# Patient Record
Sex: Male | Born: 1960
Health system: Southern US, Community
[De-identification: ages and names within clinical notes are randomized; demographics above are authoritative.]

## PROBLEM LIST (undated history)

## (undated) DIAGNOSIS — I472 Ventricular tachycardia: Secondary | ICD-10-CM

## (undated) DIAGNOSIS — K3580 Unspecified acute appendicitis: Secondary | ICD-10-CM

## (undated) DIAGNOSIS — E039 Hypothyroidism, unspecified: Secondary | ICD-10-CM

## (undated) DIAGNOSIS — I4729 Other ventricular tachycardia: Secondary | ICD-10-CM

## (undated) DIAGNOSIS — I10 Essential (primary) hypertension: Secondary | ICD-10-CM

## (undated) DIAGNOSIS — F419 Anxiety disorder, unspecified: Secondary | ICD-10-CM

## (undated) HISTORY — DX: Anxiety disorder, unspecified: F41.9

## (undated) HISTORY — DX: Unspecified acute appendicitis: K35.80

## (undated) HISTORY — DX: Other ventricular tachycardia: I47.29

## (undated) HISTORY — DX: Hypothyroidism, unspecified: E03.9

## (undated) HISTORY — DX: Ventricular tachycardia: I47.2

---

## 2007-12-09 DIAGNOSIS — K3580 Unspecified acute appendicitis: Secondary | ICD-10-CM

## 2007-12-09 HISTORY — PX: LAPAROSCOPIC APPENDECTOMY: SUR753

## 2007-12-09 HISTORY — DX: Unspecified acute appendicitis: K35.80

## 2008-03-21 ENCOUNTER — Ambulatory Visit (HOSPITAL_COMMUNITY): Admission: EM | Admit: 2008-03-21 | Discharge: 2008-03-21 | Payer: Self-pay | Admitting: Family Medicine

## 2008-03-21 ENCOUNTER — Encounter (INDEPENDENT_AMBULATORY_CARE_PROVIDER_SITE_OTHER): Payer: Self-pay | Admitting: General Surgery

## 2011-04-22 NOTE — Op Note (Signed)
David Moreno, David Moreno            ACCOUNT NO.:  [id number]   MEDICAL RECORD NO.:  [id number]          PATIENT TYPE:  INP   LOCATION:  5522                         FACILITY:  [facility]   PHYSICIAN:  Sharlet Salina T. Hoxworth, M.D.DATE OF BIRTH:  1961/10/24   DATE OF PROCEDURE:  03/21/2008  DATE OF DISCHARGE:  03/21/2008                               OPERATIVE REPORT   PREOPERATIVE DIAGNOSIS:  Acute appendicitis.   POSTOPERATIVE DIAGNOSIS:  Acute appendicitis.   SURGICAL PROCEDURE:  Laparoscopic appendectomy.   SURGEON:  Lorne Skeens. Hoxworth, MD   ANESTHESIA:  General.   BRIEF HISTORY:  David Moreno is a generally healthy 50 year old male  who presents with typical findings for acute appendicitis.  I have  recommended proceeding with laparoscopic and possible open appendectomy.  The nature of procedure, its indications, risks of bleeding, infection,  and anesthetic risks have been discussed and understood.  He has been  brought to the operating room for this procedure.   DESCRIPTION OF OPERATION:  The patient brought to the operating room and  placed in supine position on the operating table and general  endotracheal anesthesia was induced.  Foley catheter was placed.  He  received broad-spectrum preoperative IV antibiotics.  The abdomen was  widely sterilely prepped and draped.  Correct patient and procedure were  verified.  Local anesthesia was used to infiltrate the trocar sites.  An  open Roseanne Reno technique was used through a 1-cm infraumbilical incision  through a mattress suture of 0 Vicryl.  Pneumoperitoneum established.  Under direct vision, a 5-mm trocar was placed in the right upper  quadrant and a 12-mm trocar in the left lower quadrant.  The appendix  was lying anteriorly, easily visualized, and was acutely inflamed with  exudate, but no perforation or gangrene.  The appendix was inflamed up  to but not involving the very base.  Appendix was mobilized dividing  some  lateral peritoneal attachments.  The mesoappendix was then  sequentially divided with a Harmonic scalpel, completely freeing the  appendix down to the tip of the cecum.  The appendix was then divided at  the tip of the cecum with a single firing of the Endo GIA 45-mm blue  load stapler.  There was a tiny bit of bleeding along the staple line  that was controlled with Harmonic scalpel.  Complete hemostasis was  assured, and the right lower quadrant and abdomen were thoroughly  irrigated and suctioned until clear.  Appendix was placed in an  EndoCatch bag and brought out through the umbilical incision.  The  trocars removed, all CO2 evacuated, and the mattress sutures secured the  umbilicus.  Skin incisions were closed with subcuticular 5-0 Monocryl  and Dermabond.  Sponge and needle counts correct.  The patient was taken  to recovery in good condition.      Lorne Skeens. Hoxworth, M.D.  Electronically Signed    BTH/MEDQ  D:  03/21/2008  T:  03/21/2008  Job:  [id number]

## 2011-04-22 NOTE — H&P (Signed)
[name], [name]            ACCOUNT NO.:  [id number]   MEDICAL RECORD NO.:  [id number]          PATIENT TYPE:  INP   LOCATION:  1828                         FACILITY:  [facility]   PHYSICIAN:  Sharlet Salina T. Hoxworth, M.D.DATE OF BIRTH:  05-Jul-1961   DATE OF ADMISSION:  03/21/2008  DATE OF DISCHARGE:                              HISTORY & PHYSICAL   CHIEF COMPLAINTS:  Abdominal pain.   PRESENT ILLNESS:  David Moreno is a 50 year old male who about 12  hours ago had the onset of vague diffuse mid abdominal pain.  This  gradually worsened and became fairly intense.  [facility], was referred to [facility] ER for further evaluation.  He states  his pain actually then gradually subsided, but became localized in the  right lower quadrant.  It is somewhat worse with motion.  He has no  nausea or vomiting.  No fever or chills.  No diarrhea or urinary  symptoms.  He has no previous history of any similar complaints or  chronic abdominal pain or GI complaints.   PAST MEDICAL HISTORY:  Previous surgery includes left hand injury as a  child and Achilles tendon rupture.  Medically, he is followed for mild  anxiety and hypothyroidism.   MEDICATIONS:  1. Lexapro 10 mg daily.  2. Synthroid 100 mcg daily.   ALLERGIES:  NONE.   SOCIAL HISTORY:  No cigarettes, occasional alcohol.  Married.  Works for  a [employer].   FAMILY HISTORY:  Significant for mother with history of MI.   REVIEW OF SYSTEMS:  GENERAL: No fever, chills, or weight change.  HEENT:  No vision, hearing, or swallowing problems.  RESPIRATORY:  No shortness  of breath, cough, or wheezing.  CARDIAC: No chest pain, palpitations, or history of heart disease.  GI  AND GU: As above.   PHYSICAL EXAMINATION:  VITAL SIGNS: Temperature is 97.9, pulse 63,  respirations 20, and blood pressure 141/78.  GENERAL: Alert, well-developed, white male, in no acute stress.  SKIN: Warm and dry.  No rash or infection.  HEENT: No palpable mass or thyromegaly.  Sclerae nonicteric.  Oropharynx  is clear.  LUNGS: Clear without wheezing or increased work of breathing.  CARDIAC: Regular rate and rhythm.  No murmurs.  ABDOMEN: Nondistended.  There is well localized right lower quadrant  tenderness with some guarding.  No palpable masses or organomegaly.  Remainder of abdomen is soft and nontender.  GU: Normal male.  EXTREMITIES: No joint swelling or deformity.  NEUROLOGIC: Alert and fully oriented.  Motor and sensory exam is grossly  normal.   LABORATORY:  White count elevated at 14.8, hemoglobin is 14.4.  Electrolytes and UA normal.  CT scan of the abdomen and pelvis were  reviewed.  This shows the evidence of acute appendicitis without rupture  abscess.   ASSESSMENT AND PLAN:  Apparent acute appendicitis.  The patient is  receiving broad-spectrum antibiotics.  Will be taken to the operating  room for emergency laparoscopic appendectomy.      Lorne Skeens. Hoxworth, M.D.  Electronically Signed     BTH/MEDQ  D:  03/21/2008  T:  03/21/2008  Job:  [id number]

## 2011-08-08 ENCOUNTER — Other Ambulatory Visit: Payer: Self-pay | Admitting: Physical Medicine and Rehabilitation

## 2011-08-08 DIAGNOSIS — IMO0002 Reserved for concepts with insufficient information to code with codable children: Secondary | ICD-10-CM

## 2011-08-17 ENCOUNTER — Ambulatory Visit
Admission: RE | Admit: 2011-08-17 | Discharge: 2011-08-17 | Disposition: A | Discharge status: Home / Self Care | Payer: PRIVATE HEALTH INSURANCE | Source: Ambulatory Visit | Attending: Physical Medicine and Rehabilitation | Admitting: Physical Medicine and Rehabilitation

## 2011-08-17 DIAGNOSIS — IMO0002 Reserved for concepts with insufficient information to code with codable children: Secondary | ICD-10-CM

## 2011-09-02 LAB — DIFFERENTIAL
Basophils Absolute: 0
Lymphocytes Relative: 5 — ABNORMAL LOW
Monocytes Absolute: 0.7
Monocytes Relative: 5
Neutro Abs: 13.4 — ABNORMAL HIGH

## 2011-09-02 LAB — CBC
Hemoglobin: 14.4
RBC: 4.88
RDW: 13.7
WBC: 14.8 — ABNORMAL HIGH

## 2011-09-02 LAB — POCT URINALYSIS DIP (DEVICE)
Ketones, ur: >=160 — AB
pH: 6.5

## 2011-09-02 LAB — POCT I-STAT, CHEM 8
BUN: 15
Calcium, Ion: 1.2
Chloride: 102
Sodium: 139

## 2013-05-16 ENCOUNTER — Ambulatory Visit: Payer: PRIVATE HEALTH INSURANCE | Admitting: Cardiovascular Disease

## 2013-05-23 ENCOUNTER — Encounter: Payer: Self-pay | Admitting: Physician Assistant

## 2013-05-23 ENCOUNTER — Encounter: Payer: Self-pay | Admitting: Cardiovascular Disease

## 2013-05-24 ENCOUNTER — Ambulatory Visit: Payer: PRIVATE HEALTH INSURANCE | Admitting: Cardiovascular Disease

## 2013-05-31 ENCOUNTER — Encounter: Payer: Self-pay | Admitting: Cardiovascular Disease

## 2013-05-31 ENCOUNTER — Ambulatory Visit (INDEPENDENT_AMBULATORY_CARE_PROVIDER_SITE_OTHER): Payer: PRIVATE HEALTH INSURANCE | Admitting: Cardiovascular Disease

## 2013-05-31 VITALS — BP 110/80 | HR 47 | Ht 76.0 in | Wt 196.7 lb

## 2013-05-31 DIAGNOSIS — E039 Hypothyroidism, unspecified: Secondary | ICD-10-CM | POA: Insufficient documentation

## 2013-05-31 DIAGNOSIS — R002 Palpitations: Secondary | ICD-10-CM

## 2013-05-31 NOTE — Progress Notes (Signed)
Patient ID: David Moreno, male   DOB: Sep 19, 1961, 52 y.o.   MRN: [id number]     HPI: ANKUR SNOWDON, is a 52 y.o. 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 He has a history of hypothyroidism in the past had noted episodes of shortness of breath with activity and fatigue. He has been documented to have occasional PVCs as well as PACs during exercise. In September 2013 an echo Doppler study showed an ejection fraction greater than 55%. He had flat mitral valve closure without definitive prolapse, mild MR and mild TR without stenosis. On cardiac monitoring he was found to have 2 episodes of short-lived nonsustained ventricular tachycardia up to 10 beats which occurred during a period of increased stress he was rushing to go to an airport. In the past, he has developed fatigue Toprol was switched to the systolic. Since I last saw him, he did reduce his diastolic dose from 5 mg to 2.5 mg for decreased energy he remains relatively asymptomatic with reference to sensation any palpitations. He denies recent shortness of breath. There is no chest pain or he had undergone a previous nuclear perfusion study in September 2013 which was normal. He tells me his primary care physician recently titrated his thyroid replacement Synthroid from 0.125 mg to 0.1375 mg.  Past Medical History  Diagnosis Date  . Hypothyroidism   . NSVT (nonsustained ventricular tachycardia)     Nuclear stress test September 2013 was negative for ischemia. Exercise capacity 16 METS, low risk scan.  2-D echocardiogram September 2013 ejection fraction greater than 55%. No valvular disease noted.Marland Kitchen    No past surgical history on file.  Not on File  Current Outpatient Prescriptions  Medication Sig Dispense Refill  . aspirin 81 MG tablet Take 81 mg by mouth daily.      Marland Kitchen escitalopram (LEXAPRO) 10 MG tablet Take 10 mg by mouth daily.      Marland Kitchen levothyroxine (SYNTHROID, LEVOTHROID) 125 MCG tablet Take 125 mcg by mouth daily before  breakfast.      . nebivolol (BYSTOLIC) 5 MG tablet Take 5 mg by mouth daily. Takes 1/2 tablet      . zolpidem (AMBIEN) 10 MG tablet Take 10 mg by mouth at bedtime as needed for sleep. Takes 1/2 tablet prn       No current facility-administered medications for this visit.    Socially he is married and has 2 children. He is originally from [place]. He works in Airline pilot for [employer] There is no tobacco use. Does drink occasional alcohol.  ROS is negative for fever chills night sweats. He denies any recurrent awareness of palpitations. Fatigue has improved by reducing his Bystolic dose to 2.5 mg. He denies edema. He denies bleeding. He denies gynecologist. Other system review is negative.  PE BP 110/80  Pulse 47  Ht 6\' 4"  (1.93 m)  Wt 196 lb 11.2 oz (89.223 kg)  BMI 23.95 kg/m2  General: Alert, oriented, no distress.  Skin: normal turgor, no rashes HEENT: Normocephalic, atraumatic. Pupils round and reactive; sclera anicteric;no lid lag,  Nose without nasal septal hypertrophy Mouth/Parynx benign; Mallinpatti scale 2 Neck: No JVD, no carotid briuts Lungs: clear to ausculatation and percussion; no wheezing or rales Heart: RRR, s1 s2 norma;l 1/6 systolic murmur with an intermittent systolic click or Abdomen: soft, nontender; no hepatosplenomehaly, BS+; abdominal aorta nontender and not dilated by palpation. Pulses 2+ Extremities: no clubbing cyanosis or edema, Homan's sign negative  Neurologic: grossly nonfocal  ECG:  Sinus rhythm at 47 beats per minute with several atrial premature complexes  LABS:  BMET    Component Value Date/Time   NA 139 03/20/2008 2111   K 3.8 03/20/2008 2111   CL 102 03/20/2008 2111   GLUCOSE 122* 03/20/2008 2111   BUN 15 03/20/2008 2111   CREATININE 1.3 03/20/2008 2111     Hepatic Function Panel  No results found for this basename: prot, albumin, ast, alt, alkphos, bilitot, bilidir, ibili     CBC    Component Value Date/Time   WBC 14.8*  03/20/2008 2103   RBC 4.88 03/20/2008 2103   HGB 16.0 03/20/2008 2111   HCT 47.0 03/20/2008 2111   PLT 177 03/20/2008 2103   MCV 88.9 03/20/2008 2103   MCHC 33.2 03/20/2008 2103   RDW 13.7 03/20/2008 2103   LYMPHSABS 0.7 03/20/2008 2103   MONOABS 0.7 03/20/2008 2103   EOSABS 0.0 03/20/2008 2103   BASOSABS 0.0 03/20/2008 2103     BNP No results found for this basename: probnp    Lipid Panel  No results found for this basename: chol, trig, hdl, cholhdl, vldl, ldlcalc     RADIOLOGY: No results found.    ASSESSMENT AND PLAN: Mr. Rountree continues to feel well. He does have bradycardia at a very reduced rate of Bystolic at 2.5 mg and still has occasional PACs. In the past, he had developed a short burst of nonsustained VT during a stressful episode and for this reason I will not discontinue his low dose beta blocker therapy. Apparently his thyroid dose was recently increased and will be important to make certain he does not develop over suppression of his TSH. He tells me his primary physician we'll be rechecking his laboratory he has undergone recent colonoscopy that adverse effects. I will see him in 6 months for followup evaluation.     Lennette Bihari, MD, [facility]  05/31/2013 10:35 PM

## 2013-05-31 NOTE — Patient Instructions (Signed)
Your physician recommends that you schedule a follow-up appointment in: 6 months  

## 2013-06-01 ENCOUNTER — Encounter: Payer: Self-pay | Admitting: Cardiovascular Disease

## 2013-10-19 ENCOUNTER — Other Ambulatory Visit: Payer: Self-pay | Admitting: Cardiology

## 2013-10-19 NOTE — Telephone Encounter (Signed)
Rx was sent to pharmacy electronically. 

## 2013-11-22 ENCOUNTER — Ambulatory Visit (INDEPENDENT_AMBULATORY_CARE_PROVIDER_SITE_OTHER): Payer: PRIVATE HEALTH INSURANCE | Admitting: Cardiovascular Disease

## 2013-11-22 ENCOUNTER — Encounter: Payer: Self-pay | Admitting: Cardiovascular Disease

## 2013-11-22 VITALS — BP 120/70 | HR 52 | Ht 75.0 in | Wt 193.5 lb

## 2013-11-22 DIAGNOSIS — I4729 Other ventricular tachycardia: Secondary | ICD-10-CM

## 2013-11-22 DIAGNOSIS — E039 Hypothyroidism, unspecified: Secondary | ICD-10-CM

## 2013-11-22 DIAGNOSIS — R002 Palpitations: Secondary | ICD-10-CM

## 2013-11-22 DIAGNOSIS — I472 Ventricular tachycardia: Secondary | ICD-10-CM

## 2013-11-22 DIAGNOSIS — I491 Atrial premature depolarization: Secondary | ICD-10-CM

## 2013-11-22 NOTE — Patient Instructions (Signed)
Your physician recommends that you schedule a follow-up appointment in: one year with Dr Allyson Sabal  No changes made to your medications/therapy

## 2013-11-24 ENCOUNTER — Encounter: Payer: Self-pay | Admitting: Cardiovascular Disease

## 2013-11-24 DIAGNOSIS — I472 Ventricular tachycardia: Secondary | ICD-10-CM | POA: Insufficient documentation

## 2013-11-24 DIAGNOSIS — I4729 Other ventricular tachycardia: Secondary | ICD-10-CM | POA: Insufficient documentation

## 2013-11-24 DIAGNOSIS — I491 Atrial premature depolarization: Secondary | ICD-10-CM | POA: Insufficient documentation

## 2013-11-24 NOTE — Progress Notes (Signed)
Patient ID: David Moreno, male   DOB: 22-Jul-1961, 52 y.o.   MRN: [id number]      HPI: David Moreno, is a 52 y.o. male who presents for six month cardiology evaluation.   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 He has a history of hypothyroidism in the past had noted episodes of shortness of breath with activity and fatigue. He has been documented to have occasional PVCs as well as PACs during exercise. In September 2013 an echo Doppler study showed an ejection fraction greater than 55%. He had flat mitral valve closure without definitive prolapse, mild MR and mild TR without stenosis. On cardiac monitoring he was found to have 2 episodes of short-lived nonsustained ventricular tachycardia up to 10 beats which occurred during a period of increased stress he was rushing to go to an airport. In the past, he has developed fatigue Toprol was switched to  bystolic. Since I  reduce his diastolic dose from 5 mg to 2.5 mg for decreased energy he remains relatively asymptomatic with reference to sensation any palpitations. He denies recent shortness of breath. There is no chest pain or he had undergone a previous nuclear perfusion study in September 2013 which was normal. He tells me his primary care physician recently titrated his thyroid replacement Synthroid from 0.125 mg to 0.1375 mg.  Over the past year he has felt well. He remained active. He plays tennis. He denies any further spells of exertionally precipitated shortness of breath and fatigue.  Past Medical History  Diagnosis Date  . Hypothyroidism   . NSVT (nonsustained ventricular tachycardia)     Nuclear stress test September 2013 was negative for ischemia. Exercise capacity 16 METS, low risk scan.  2-D echocardiogram September 2013 ejection fraction greater than 55%. No valvular disease noted.Marland Kitchen    History reviewed. No pertinent past surgical history.  No Known Allergies  Current Outpatient  Prescriptions  Medication Sig Dispense Refill  . aspirin 81 MG tablet Take 81 mg by mouth daily.      Marland Kitchen escitalopram (LEXAPRO) 10 MG tablet Take 10 mg by mouth daily.      Marland Kitchen levothyroxine (SYNTHROID, LEVOTHROID) 125 MCG tablet Take 137 mcg by mouth daily before breakfast.       . nebivolol (BYSTOLIC) 5 MG tablet Take 0.5 tablets (2.5 mg total) by mouth daily.  15 tablet  5  . zolpidem (AMBIEN) 10 MG tablet Take 10 mg by mouth at bedtime as needed for sleep. Takes 1/2 tablet prn       No current facility-administered medications for this visit.    Socially he is married and has 2 children. He is originally from [place]. He works in Airline pilot for [employer] There is no tobacco use. Does drink occasional alcohol.  ROS is negative for fever chills night sweats. He denies skin rash. He denies cold or heat intolerance. He denies visual or hearing changes. There is no lymphadenopathy. No wheezing or increased sputum production. There is no presyncope or syncope. There is no chest pressure. He denies nausea vomiting or diarrhea. He is unaware of blood in stool or urine. He denies myalgias. He denies any recurrent awareness of palpitations. Fatigue has improved by reducing his Bystolic dose to 2.5 mg. He denies edema. He denies bleeding. There is no history of diabetes. He is on Synthroid for hypothyroidism. He denies difficulty with sleep or snoring.  Other comprehensive 14 system review is negative.  PE BP  120/70  Pulse 52  Ht 6\' 3"  (1.905 m)  Wt 193 lb 8 oz (87.771 kg)  BMI 24.19 kg/m2  Repeat blood pressure by me was 120/70 supine and was 118/72 standing. General: Alert, oriented, no distress.  Skin: normal turgor, no rashes HEENT: Normocephalic, atraumatic. Pupils round and reactive; sclera anicteric;no lid lag,  Nose without nasal septal hypertrophy Mouth/Parynx benign; Mallinpatti scale 2 Neck: No JVD, no carotid briuts Lungs: clear to ausculatation and percussion; no wheezing or  rales Chest: No tenderness to palpation Heart: RRR, s1 s2 norma;l 1/6 systolic murmur with an intermittent systolic click  Abdomen: soft, nontender; no hepatosplenomehaly, BS+; abdominal aorta nontender and not dilated by palpation. Back: No CVA tenderness Pulses 2+ Extremities: no clubbing cyanosis or edema, Homan's sign negative  Neurologic: grossly nonfocal; cranial nerves normal Psychological: Normal affect and mood. Normal cognition.  ECG: Sinus rhythm at 52 beats per minute with one isolated atrial premature complex.  LABS:  BMET    Component Value Date/Time   NA 139 03/20/2008 2111   K 3.8 03/20/2008 2111   CL 102 03/20/2008 2111   GLUCOSE 122* 03/20/2008 2111   BUN 15 03/20/2008 2111   CREATININE 1.3 03/20/2008 2111     Hepatic Function Panel  No results found for this basename: prot,  albumin,  ast,  alt,  alkphos,  bilitot,  bilidir,  ibili     CBC    Component Value Date/Time   WBC 14.8* 03/20/2008 2103   RBC 4.88 03/20/2008 2103   HGB 16.0 03/20/2008 2111   HCT 47.0 03/20/2008 2111   PLT 177 03/20/2008 2103   MCV 88.9 03/20/2008 2103   MCHC 33.2 03/20/2008 2103   RDW 13.7 03/20/2008 2103   LYMPHSABS 0.7 03/20/2008 2103   MONOABS 0.7 03/20/2008 2103   EOSABS 0.0 03/20/2008 2103   BASOSABS 0.0 03/20/2008 2103     BNP No results found for this basename: probnp    Lipid Panel  No results found for this basename: chol,  trig,  hdl,  cholhdl,  vldl,  ldlcalc     RADIOLOGY: No results found.    ASSESSMENT AND PLAN: David Moreno continues to feel well. He continues to be mildly bradycardic at a  reduced dose of Bystolic at 2.5 mg and  has rare PACs. In the past, he had developed a short burst of nonsustained VT during a stressful episode and for this reason I will not discontinue his low dose beta blocker therapy. His thyroid dose had been increased this year and he understands his TSH in June was normal at 3.59 on 137 mcg daily. His blood pressure is well controlled and  there are no orthostatic changes. I recommended he continue his current medical regimen. I will see him in one year for followup evaluation or sooner if problems arise.   Lennette Bihari, MD, [facility]  11/24/2013 9:45 PM

## 2013-11-25 ENCOUNTER — Encounter: Payer: Self-pay | Admitting: Cardiovascular Disease

## 2014-04-18 ENCOUNTER — Other Ambulatory Visit: Payer: Self-pay | Admitting: Cardiovascular Disease

## 2014-04-18 NOTE — Telephone Encounter (Signed)
Rx refill sent to patient pharmacy   

## 2014-06-07 ENCOUNTER — Encounter: Payer: Self-pay | Admitting: Cardiovascular Disease

## 2014-06-07 ENCOUNTER — Ambulatory Visit (INDEPENDENT_AMBULATORY_CARE_PROVIDER_SITE_OTHER): Payer: PRIVATE HEALTH INSURANCE | Admitting: Cardiovascular Disease

## 2014-06-07 VITALS — BP 122/70 | HR 57

## 2014-06-07 DIAGNOSIS — R0789 Other chest pain: Secondary | ICD-10-CM

## 2014-06-07 DIAGNOSIS — I491 Atrial premature depolarization: Secondary | ICD-10-CM

## 2014-06-07 DIAGNOSIS — I4729 Other ventricular tachycardia: Secondary | ICD-10-CM

## 2014-06-07 DIAGNOSIS — R5383 Other fatigue: Secondary | ICD-10-CM | POA: Insufficient documentation

## 2014-06-07 DIAGNOSIS — I472 Ventricular tachycardia, unspecified: Secondary | ICD-10-CM

## 2014-06-07 DIAGNOSIS — R5381 Other malaise: Secondary | ICD-10-CM

## 2014-06-07 DIAGNOSIS — R002 Palpitations: Secondary | ICD-10-CM

## 2014-06-07 DIAGNOSIS — E039 Hypothyroidism, unspecified: Secondary | ICD-10-CM

## 2014-06-07 DIAGNOSIS — R5382 Chronic fatigue, unspecified: Secondary | ICD-10-CM

## 2014-06-07 NOTE — Patient Instructions (Signed)
Your physician recommends that you return for lab work fasting.  Your physician has recommended you make the following change in your medication: return to taking 2.5 mg of the Bystolic.  Your physician recommends that you schedule a follow-up appointment in: 3 months.

## 2014-06-07 NOTE — Progress Notes (Signed)
Patient ID: David [place], male   DOB: 06-Feb-1961, 53 y.o.   MRN: [id number]      HPI: David [place]no is a 53 y.o. male who presents for cardiology evaluation. Chief complaint is that of increased fatigue.  David [place] is a 53 year old male who works for [employer] in [employer]. He has a history of hypothyroidism in the past had noted episodes of shortness of breath with activity and fatigue. He has been documented to have occasional PVCs as well as PACs during exercise. In September 2013 an echo Doppler study showed an ejection fraction greater than 55%. He had flat mitral valve closure without definitive prolapse, mild MR and mild TR without stenosis. On cardiac monitoring he was found to have 2 episodes of short-lived nonsustained ventricular tachycardia up to 10 beats which occurred during a period of increased stress he was rushing to go to an airport. In the past, he has developed fatigue Toprol was switched to  bystolic. I later reduced his bystolic dose from 5 mg to 2.5 mg for decreased energy he remains relatively asymptomatic with reference to sensation any palpitations. He denied shortness of breath. There is no chest pain or he had undergone a previous nuclear perfusion study in September 2013 which was normal. His primary care physician recently titrated his thyroid replacement Synthroid from 0.125 mg to 0.1375 mg.  Over the past year he has felt well. He remained active. He plays tennis. He denies any further spells of exertionally precipitated shortness of breath and fatigue.  Recently he has noticed the recurrence of more fatigue.  He has not been playing tennis.  On his own, he reduced his dose of Bystolic from 2.5 mg to one half of a 2.5 mg dose.  He is reading tomorrow morning to go [facility] [place] [facility] [place].  He thought it best to be checked prior to his departure.  He comes for evaluation.  He denies any recent increased stress or anxiety.  He denies caffeine  use.  Past Medical History  Diagnosis Date  . Hypothyroidism   . NSVT (nonsustained ventricular tachycardia)     Nuclear stress test September 2013 was negative for ischemia. Exercise capacity 16 METS, low risk scan.  2-D echocardiogram September 2013 ejection fraction greater than 55%. No valvular disease noted.Marland Kitchen.    History reviewed. No pertinent past surgical history.  No Known Allergies  Current Outpatient Prescriptions  Medication Sig Dispense Refill  . aspirin 81 MG tablet Take 81 mg by mouth daily.      Marland Kitchen. escitalopram (LEXAPRO) 10 MG tablet Take 10 mg by mouth daily.      Marland Kitchen. levothyroxine (SYNTHROID, LEVOTHROID) 125 MCG tablet Take 137 mcg by mouth daily before breakfast.       . nebivolol (BYSTOLIC) 5 MG tablet take 1/2 tablet daily      . zolpidem (AMBIEN) 10 MG tablet Take 10 mg by mouth at bedtime as needed for sleep. Takes 1/2 tablet prn       No current facility-administered medications for this visit.    Socially he is married and has 2 children. He is originally from [place]. He works in [employer] for [employer]. There is no tobacco use. Does drink occasional alcohol.  ROS General: Positive for fatigue; No fevers, chills, or night sweats;  HEENT: Negative; No changes in vision or hearing, sinus congestion, difficulty swallowing Pulmonary: Negative; No cough, wheezing, shortness of breath, hemoptysis Cardiovascular: Negative; No chest pain, presyncope, syncope, he is  unaware of any palpitations GI: Negative; No nausea, vomiting, diarrhea, or abdominal pain GU: Negative; No dysuria, hematuria, or difficulty voiding Musculoskeletal: Negative; no myalgias, joint pain, or weakness Hematologic/Oncology: Negative; no easy bruising, bleeding Endocrine: He has a history of hypothyroidism and is on Synthroid replacement no heat/cold intolerance; no diabetes Neuro: Negative; no changes in balance, headaches Skin: Negative; No rashes or skin lesions Psychiatric: Negative;  No behavioral problems, depression Sleep: Negative; No snoring, daytime sleepiness, hypersomnolence, bruxism, restless legs, hypnogognic hallucinations, no cataplexy Other comprehensive 14 point system review is negative.   PE BP 122/70  Pulse 57  Repeat blood pressure by me was 120/70 supine and was 118/72 standing. General: Alert, oriented, no distress.  Skin: normal turgor, no rashes HEENT: Normocephalic, atraumatic. Pupils round and reactive; sclera anicteric;no lid lag,  Nose without nasal septal hypertrophy Mouth/Parynx benign; Mallinpatti scale 2 Neck: No JVD, no carotid bruits with normal carotid up to Lungs: clear to ausculatation and percussion; no wheezing or rales Chest: No tenderness to palpation Heart: RRR, s1 s2 norma;l 1/6 systolic murmur with an intermittent systolic click  Abdomen: soft, nontender; no hepatosplenomehaly, BS+; abdominal aorta nontender and not dilated by palpation. Back: No CVA tenderness Pulses 2+ Extremities: no clubbing cyanosis or edema, Homan's sign negative  Neurologic: grossly nonfocal; cranial nerves normal Psychological: Normal affect and mood. Normal cognition.  ECG (independently read by me and (atrial bigeminal rhythm with underlying sinus bradycardia.  He has a different morphology to his P wave with his atrial premature beat.  I did not see any clear-cut block T waves to suggest Mobitz rhythm.  Prior December 2014 ECG: Sinus rhythm at 52 beats per minute with one isolated atrial premature complex.  LABS:  BMET    Component Value Date/Time   NA 139 03/20/2008 2111   K 3.8 03/20/2008 2111   CL 102 03/20/2008 2111   GLUCOSE 122* 03/20/2008 2111   BUN 15 03/20/2008 2111   CREATININE 1.3 03/20/2008 2111     Hepatic Function Panel  No results found for this basename: prot,  albumin,  ast,  alt,  alkphos,  bilitot,  bilidir,  ibili     CBC    Component Value Date/Time   WBC 14.8* 03/20/2008 2103   RBC 4.88 03/20/2008 2103   HGB  16.0 03/20/2008 2111   HCT 47.0 03/20/2008 2111   PLT 177 03/20/2008 2103   MCV 88.9 03/20/2008 2103   MCHC 33.2 03/20/2008 2103   RDW 13.7 03/20/2008 2103   LYMPHSABS 0.7 03/20/2008 2103   MONOABS 0.7 03/20/2008 2103   EOSABS 0.0 03/20/2008 2103   BASOSABS 0.0 03/20/2008 2103     BNP No results found for this basename: probnp    Lipid Panel  No results found for this basename: chol,  trig,  hdl,  cholhdl,  vldl,  ldlcalc     RADIOLOGY: No results found.    ASSESSMENT AND PLAN:  [place] has documented normal systolic function with an ejection fraction on echocardiography in September 2013 of greater than 55%.  Remotely, he had been demonstrated to have short-lived nonsustained ventricular tachycardic episodes up to 10 beats, which occurred during periods of increased stress.  A nuclear perfusion study demonstrated good exercise tolerance without ischemia.  I am recommending repeat laboratory be checked including a cemented, magnesium level, free T3, free T4 and TSH level and vitamin D level.  Dr. Erling ConteSwain has been checking his lipids.  I recommended he try reinstituting his Bystolic back at 2.5  mg and he can try taking this one half twice a day iffatigue is an issue.  He will be getting his blood work done after he returns from his trip to [place].  I will contact him regarding results.  I'll see him back in the office in 2-3 months for followup evaluation or sooner if problems persist.  Lennette Bihari, MD, [facility]  06/07/2014 5:35 PM

## 2014-06-26 LAB — CBC
HCT: 43.4 % (ref 39.0–52.0)
Hemoglobin: 14.8 g/dL (ref 13.0–17.0)
MCH: 29.5 pg (ref 26.0–34.0)
MCHC: 34.1 g/dL (ref 30.0–36.0)
MCV: 86.6 fL (ref 78.0–100.0)
Platelets: 196 10*3/uL (ref 150–400)
RBC: 5.01 MIL/uL (ref 4.22–5.81)
RDW: 14.3 % (ref 11.5–15.5)
WBC: 5.7 10*3/uL (ref 4.0–10.5)

## 2014-06-27 LAB — COMPREHENSIVE METABOLIC PANEL
ALBUMIN: 4.4 g/dL (ref 3.5–5.2)
ALK PHOS: 51 U/L (ref 39–117)
ALT: 15 U/L (ref 0–53)
AST: 20 U/L (ref 0–37)
BILIRUBIN TOTAL: 0.6 mg/dL (ref 0.2–1.2)
BUN: 16 mg/dL (ref 6–23)
CO2: 28 mEq/L (ref 19–32)
Calcium: 9.3 mg/dL (ref 8.4–10.5)
Chloride: 103 mEq/L (ref 96–112)
Creat: 0.95 mg/dL (ref 0.50–1.35)
Glucose, Bld: 67 mg/dL — ABNORMAL LOW (ref 70–99)
POTASSIUM: 4.2 meq/L (ref 3.5–5.3)
SODIUM: 141 meq/L (ref 135–145)
TOTAL PROTEIN: 7 g/dL (ref 6.0–8.3)

## 2014-06-27 LAB — TSH: TSH: 3.496 u[IU]/mL (ref 0.350–4.500)

## 2014-06-27 LAB — T3, FREE: T3 FREE: 2.8 pg/mL (ref 2.3–4.2)

## 2014-06-27 LAB — MAGNESIUM: Magnesium: 2 mg/dL (ref 1.5–2.5)

## 2014-06-27 LAB — T4, FREE: Free T4: 1.43 ng/dL (ref 0.80–1.80)

## 2014-06-29 LAB — VITAMIN D 1,25 DIHYDROXY
VITAMIN D 1, 25 (OH) TOTAL: 42 pg/mL (ref 18–72)
VITAMIN D3 1, 25 (OH): 42 pg/mL

## 2014-07-24 ENCOUNTER — Telehealth: Payer: Self-pay | Admitting: Cardiovascular Disease

## 2014-07-24 NOTE — Telephone Encounter (Signed)
Pt wants his lab results from last week sent to his primary care doctor. Dr Azucena CecilSwayne at [facility] is his family doctor,did not state which [facility] practice he was with.

## 2014-07-24 NOTE — Telephone Encounter (Signed)
Lab results forwarded to PCP.

## 2014-10-04 ENCOUNTER — Other Ambulatory Visit: Payer: Self-pay | Admitting: Cardiovascular Disease

## 2014-10-04 NOTE — Telephone Encounter (Signed)
Rx was sent to pharmacy electronically. 

## 2015-02-27 ENCOUNTER — Other Ambulatory Visit: Payer: Self-pay | Admitting: *Deleted

## 2015-02-27 NOTE — Telephone Encounter (Addendum)
Opened in error

## 2015-06-05 ENCOUNTER — Other Ambulatory Visit: Payer: Self-pay | Admitting: Family Medicine

## 2015-06-05 DIAGNOSIS — I8002 Phlebitis and thrombophlebitis of superficial vessels of left lower extremity: Secondary | ICD-10-CM

## 2015-06-08 ENCOUNTER — Other Ambulatory Visit (HOSPITAL_COMMUNITY): Payer: Self-pay | Admitting: Family Medicine

## 2015-06-08 ENCOUNTER — Ambulatory Visit (HOSPITAL_COMMUNITY)
Admission: RE | Admit: 2015-06-08 | Discharge: 2015-06-08 | Disposition: A | Discharge status: Home / Self Care | Payer: 59 | Source: Ambulatory Visit | Attending: Family Medicine | Admitting: Family Medicine

## 2015-06-08 DIAGNOSIS — R609 Edema, unspecified: Secondary | ICD-10-CM

## 2015-06-08 DIAGNOSIS — I8392 Asymptomatic varicose veins of left lower extremity: Secondary | ICD-10-CM | POA: Insufficient documentation

## 2015-06-08 DIAGNOSIS — M7989 Other specified soft tissue disorders: Secondary | ICD-10-CM | POA: Diagnosis present

## 2015-06-08 NOTE — Progress Notes (Signed)
VASCULAR LAB PRELIMINARY  PRELIMINARY  PRELIMINARY  PRELIMINARY  Left lower extremity venous duplex completed.    Preliminary report:  No evidence of left lower extremity DVT or Baker's cyst. Positive for varicose thrombus noted in the distal left medial thigh.  Axcel Horsch, RVS 06/08/2015, 3:32 PM

## 2015-06-12 ENCOUNTER — Other Ambulatory Visit: Payer: PRIVATE HEALTH INSURANCE

## 2015-06-13 ENCOUNTER — Other Ambulatory Visit: Payer: PRIVATE HEALTH INSURANCE

## 2015-07-23 ENCOUNTER — Ambulatory Visit: Payer: 59 | Admitting: Cardiovascular Disease

## 2015-07-25 ENCOUNTER — Ambulatory Visit (INDEPENDENT_AMBULATORY_CARE_PROVIDER_SITE_OTHER): Payer: 59 | Admitting: Cardiovascular Disease

## 2015-07-25 ENCOUNTER — Encounter: Payer: Self-pay | Admitting: Cardiovascular Disease

## 2015-07-25 VITALS — BP 106/80 | HR 57 | Ht 76.0 in | Wt 199.6 lb

## 2015-07-25 DIAGNOSIS — I4729 Other ventricular tachycardia: Secondary | ICD-10-CM

## 2015-07-25 DIAGNOSIS — I472 Ventricular tachycardia: Secondary | ICD-10-CM

## 2015-07-25 DIAGNOSIS — Z79899 Other long term (current) drug therapy: Secondary | ICD-10-CM

## 2015-07-25 DIAGNOSIS — G4730 Sleep apnea, unspecified: Secondary | ICD-10-CM

## 2015-07-25 DIAGNOSIS — I491 Atrial premature depolarization: Secondary | ICD-10-CM

## 2015-07-25 DIAGNOSIS — R5382 Chronic fatigue, unspecified: Secondary | ICD-10-CM

## 2015-07-25 DIAGNOSIS — G4720 Circadian rhythm sleep disorder, unspecified type: Secondary | ICD-10-CM

## 2015-07-25 DIAGNOSIS — G478 Other sleep disorders: Secondary | ICD-10-CM

## 2015-07-25 DIAGNOSIS — R002 Palpitations: Secondary | ICD-10-CM | POA: Diagnosis not present

## 2015-07-25 DIAGNOSIS — Z13220 Encounter for screening for lipoid disorders: Secondary | ICD-10-CM

## 2015-07-25 DIAGNOSIS — E039 Hypothyroidism, unspecified: Secondary | ICD-10-CM | POA: Diagnosis not present

## 2015-07-25 NOTE — Patient Instructions (Addendum)
Your physician has recommended that you have a sleep study. This test records several body functions during sleep, including: brain activity, eye movement, oxygen and carbon dioxide blood levels, heart rate and rhythm, breathing rate and rhythm, the flow of air through your mouth and nose, snoring, body muscle movements, and chest and belly movement. This will be done at [facility].  Your physician recommends that you return for lab work fasting.  Your physician wants you to follow-up in: 6 months or sooner pending sleep study results. You will receive a reminder letter in the mail two months in advance. If you don't receive a letter, please call our office to schedule the follow-up appointment.

## 2015-07-26 ENCOUNTER — Encounter: Payer: Self-pay | Admitting: Cardiovascular Disease

## 2015-07-26 DIAGNOSIS — G4730 Sleep apnea, unspecified: Secondary | ICD-10-CM | POA: Insufficient documentation

## 2015-07-26 NOTE — Progress Notes (Signed)
Patient ID: [place], male   DOB: 09-09-1961, 54 y.o.   MRN: [id number]     HPI: [place] is a 54 y.o. male who presents for a one year follow-up cardiology evaluation. Chief complaint is that of significant increased fatigue.  [place]  works for [employer] in [employer]. He has a history of hypothyroidism and has experienced episodes of shortness of breath with activity and fatigue. He has been documented to have occasional PVCs as well as PACs during exercise. In September 2013 an echo Doppler study showed an ejection fraction greater than 55%. He had flat mitral valve closure without definitive prolapse, mild MR and mild TR without stenosis. On cardiac monitoring he was found to have 2 episodes of short-lived nonsustained ventricular tachycardia up to 10 beats which occurred during a period of increased stress as he was rushing to go to an airport. In the past, he has developed fatigue and Toprol was switched to  bystolic. I later reduced his bystolic dose from 5 mg to 2.5 mg for decreased energy he remains relatively asymptomatic with reference to sensation any palpitations.  He has been on Synthroid  0.1375 mg for hypothyroidism.  Over the past year he has felt well. He remained active. He plays tennis. He denies any further spells of exertionally precipitated shortness of breath and fatigue.  Recently, he states that his fatigue is worse.  He's been told by his family members that his snoring has significantly increased.  He is waking up 2-3 times per night.  His sleep is nonrestorative.  He denies any episodes of presyncope or syncope.  He can easily take a nap at night when he gets home from work before dinner because he is so tired.  He presents for evaluation.  Past Medical History  Diagnosis Date  . Hypothyroidism   . NSVT (nonsustained ventricular tachycardia)     Nuclear stress test September 2013 was negative for ischemia. Exercise capacity 16 METS, low risk scan.   2-D echocardiogram September 2013 ejection fraction greater than 55%. No valvular disease noted.Marland Kitchen    History reviewed. No pertinent past surgical history.  No Known Allergies  Current Outpatient Prescriptions  Medication Sig Dispense Refill  . aspirin 81 MG tablet Take 81 mg by mouth daily.    Marland Kitchen escitalopram (LEXAPRO) 10 MG tablet Take 10 mg by mouth daily.    Marland Kitchen levothyroxine (SYNTHROID, LEVOTHROID) 137 MCG tablet Take 137 mcg by mouth daily before breakfast.    . nebivolol (BYSTOLIC) 5 MG tablet take 1/2 tablet daily    . zolpidem (AMBIEN) 10 MG tablet Take 10 mg by mouth at bedtime as needed for sleep. Takes 1/2 tablet prn     No current facility-administered medications for this visit.    Socially he is married and has 2 children. He is originally from [place]. He works in [employer] for [employer]. There is no tobacco use. Does drink occasional alcohol.  ROS General: Positive for fatigue; No fevers, chills, or night sweats;  HEENT: Negative; No changes in vision or hearing, sinus congestion, difficulty swallowing Pulmonary: Negative; No cough, wheezing, shortness of breath, hemoptysis Cardiovascular: Negative; No chest pain, presyncope, syncope, he is unaware of any palpitations GI: Negative; No nausea, vomiting, diarrhea, or abdominal pain GU: Negative; No dysuria, hematuria, or difficulty voiding Musculoskeletal: Negative; no myalgias, joint pain, or weakness Hematologic/Oncology: Negative; no easy bruising, bleeding Endocrine: He has a history of hypothyroidism and is on Synthroid replacement no heat/cold intolerance;  no diabetes Neuro: Negative; no changes in balance, headaches Skin: Negative; No rashes or skin lesions Psychiatric: Negative; No behavioral problems, depression Sleep: Positive for significantly increasing snoring and daytime sleepiness; no, bruxism, restless legs, hypnogognic hallucinations, no cataplexy Other comprehensive 14 point system review is  negative.   PE BP 106/80 mmHg  Pulse 57  Ht _0  (1.93 m)  Wt 199 lb 9.6 oz (90.538 kg)  BMI 24.31 kg/m2  No significant orthostatic blood pressure change.  Wt Readings from Last 3 Encounters:  07/25/15 199 lb 9.6 oz (90.538 kg)  11/22/13 193 lb 8 oz (87.771 kg)  05/31/13 196 lb 11.2 oz (89.223 kg)   General: Alert, oriented, no distress.  Skin: normal turgor, no rashes HEENT: Normocephalic, atraumatic. Pupils round and reactive; sclera anicteric;no lid lag,  Nose without nasal septal hypertrophy Mouth/Parynx benign; Mallinpatti scale 2 Neck: No JVD, no carotid bruits with normal carotid upstroke Lungs: clear to ausculatation and percussion; no wheezing or rales Chest: No tenderness to palpation Heart: RRR, s1 s2 norma;l 1/6 systolic murmur with an intermittent systolic click  Abdomen: soft, nontender; no hepatosplenomehaly, BS+; abdominal aorta nontender and not dilated by palpation. Back: No CVA tenderness Pulses 2+ Extremities: no clubbing cyanosis or edema, Homan's sign negative  Neurologic: grossly nonfocal; cranial nerves normal Psychological: Normal affect and mood. Normal cognition.  ECG (independently read by me): Sinus bradycardia 57 bpm with sinus arrhythmia, 2 atrial premature beats.  Early repolarization.  July 2015 ECG (independently read by me and (atrial bigeminal rhythm with underlying sinus bradycardia.  He has a different morphology to his P wave with his atrial premature beat.  I did not see any clear-cut block T waves to suggest Mobitz rhythm.  Prior December 2014 ECG: Sinus rhythm at 52 beats per minute with one isolated atrial premature complex.  LABS:  BMP Latest Ref Rng 06/26/2014 03/20/2008  Glucose 70 - 99 mg/dL 67(L) 122(H)  BUN 6 - 23 mg/dL 16 15  Creatinine 0.50 - 1.35 mg/dL 0.95 1.3  Sodium 135 - 145 mEq/L 141 139  Potassium 3.5 - 5.3 mEq/L 4.2 3.8  Chloride 96 - 112 mEq/L 103 102  CO2 19 - 32 mEq/L 28 -  Calcium 8.4 - 10.5 mg/dL 9.3 -    Hepatic Function Latest Ref Rng 06/26/2014  Total Protein 6.0 - 8.3 g/dL 7.0  Albumin 3.5 - 5.2 g/dL 4.4  AST 0 - 37 U/L 20  ALT 0 - 53 U/L 15  Alk Phosphatase 39 - 117 U/L 51  Total Bilirubin 0.2 - 1.2 mg/dL 0.6   CBC Latest Ref Rng 06/26/2014 03/20/2008 03/20/2008  WBC 4.0 - 10.5 K/uL 5.7 - 14.8(H)  Hemoglobin 13.0 - 17.0 g/dL 14.8 16.0 14.4  Hematocrit 39.0 - 52.0 % 43.4 47.0 43.4  Platelets 150 - 400 K/uL 196 - 177   Lab Results  Component Value Date   MCV 86.6 06/26/2014   MCV 88.9 03/20/2008   Lab Results  Component Value Date   TSH 3.496 06/26/2014  No results found for: HGBA1C   Lipid Panel  No results found for: CHOL, TRIG, HDL, CHOLHDL, VLDL, LDLCALC, LDLDIRECT  RADIOLOGY: No results found.    ASSESSMENT AND PLAN: [place] is a 54 year old white male who has documented normal systolic function with an ejection fraction on echocardiography in September 2013 of greater than 55%.  Remotely, he had been demonstrated to have short-lived nonsustained ventricular tachycardic episodes up to 10 beats, which occurred during periods of increased stress.  A nuclear perfusion study demonstrated good exercise tolerance without ischemia.  His ECG today shows sinus rhythm with mild sinus arrhythmia and 2 PAC's.  I am recommending laboratory be rechecked in the fasting state.  He continues to be on very low-dose Bystolic at just 2.5 mg.  His blood pressure is stable.  We will check his thyroid function.  His major complaint is that of progressive fatigability with increasing snoring and poor sleep.  I am referring him for a sleep study to evaluate for obstructive sleep apnea.  There is a family history for sleep apnea in his mother as well as brother who utilize CPAP therapy.  His brother is not heavy.  If he tests positive for sleep apnea.  I will see him in sleep clinic following initiation of CPAP therapy.  If he does not require CPAP, I will then see him in 6 months for cardiology  reassessment.  Time spent: 25 minutes  Troy Sine, MD, [facility]  07/26/2015 7:21 PM

## 2015-07-31 ENCOUNTER — Other Ambulatory Visit: Payer: Self-pay | Admitting: Cardiovascular Disease

## 2015-07-31 NOTE — Telephone Encounter (Signed)
REFILL 

## 2015-08-16 LAB — CBC
HEMATOCRIT: 45.8 % (ref 39.0–52.0)
Hemoglobin: 15.2 g/dL (ref 13.0–17.0)
MCH: 29.9 pg (ref 26.0–34.0)
MCHC: 33.2 g/dL (ref 30.0–36.0)
MCV: 90.2 fL (ref 78.0–100.0)
MPV: 10.5 fL (ref 8.6–12.4)
PLATELETS: 188 10*3/uL (ref 150–400)
RBC: 5.08 MIL/uL (ref 4.22–5.81)
RDW: 14.2 % (ref 11.5–15.5)
WBC: 5 10*3/uL (ref 4.0–10.5)

## 2015-08-16 LAB — COMPREHENSIVE METABOLIC PANEL
ALT: 16 U/L (ref 9–46)
AST: 21 U/L (ref 10–35)
Albumin: 4.6 g/dL (ref 3.6–5.1)
Alkaline Phosphatase: 50 U/L (ref 40–115)
BILIRUBIN TOTAL: 0.7 mg/dL (ref 0.2–1.2)
BUN: 15 mg/dL (ref 7–25)
CALCIUM: 9.5 mg/dL (ref 8.6–10.3)
CO2: 29 mmol/L (ref 20–31)
Chloride: 103 mmol/L (ref 98–110)
Creat: 0.92 mg/dL (ref 0.70–1.33)
GLUCOSE: 91 mg/dL (ref 65–99)
Potassium: 4.5 mmol/L (ref 3.5–5.3)
SODIUM: 139 mmol/L (ref 135–146)
Total Protein: 7.2 g/dL (ref 6.1–8.1)

## 2015-08-16 LAB — MAGNESIUM: MAGNESIUM: 1.9 mg/dL (ref 1.5–2.5)

## 2015-08-16 LAB — TSH: TSH: 3.388 u[IU]/mL (ref 0.350–4.500)

## 2015-08-16 LAB — LIPID PANEL
Cholesterol: 195 mg/dL (ref 125–200)
HDL: 58 mg/dL (ref >=40)
LDL CALC: 116 mg/dL (ref <130)
Total CHOL/HDL Ratio: 3.4 Ratio (ref <=5.0)
Triglycerides: 106 mg/dL (ref <150)
VLDL: 21 mg/dL (ref <30)

## 2015-08-23 ENCOUNTER — Telehealth: Payer: Self-pay | Admitting: *Deleted

## 2015-08-23 NOTE — Telephone Encounter (Signed)
-----   Message from Lennette Bihari, MD sent at 08/19/2015  7:51 PM EDT ----- Labs great

## 2015-08-23 NOTE — Telephone Encounter (Signed)
Left message labs reviewed by Dr. Tresa Endo and reported as great.

## 2015-09-12 ENCOUNTER — Ambulatory Visit: Payer: 59 | Admitting: Cardiovascular Disease

## 2015-10-15 ENCOUNTER — Encounter (HOSPITAL_BASED_OUTPATIENT_CLINIC_OR_DEPARTMENT_OTHER): Payer: 59

## 2015-10-18 ENCOUNTER — Telehealth: Payer: Self-pay | Admitting: *Deleted

## 2015-10-18 NOTE — Telephone Encounter (Signed)
Faxed order for home sleep study to [facility].

## 2016-01-07 ENCOUNTER — Telehealth: Payer: Self-pay | Admitting: Cardiovascular Disease

## 2016-01-07 NOTE — Telephone Encounter (Signed)
Pt would like his sleep study results from 11-06-15 please.

## 2016-03-01 ENCOUNTER — Other Ambulatory Visit: Payer: Self-pay | Admitting: Cardiovascular Disease

## 2016-03-18 ENCOUNTER — Telehealth: Payer: Self-pay | Admitting: Cardiovascular Disease

## 2016-03-18 NOTE — Telephone Encounter (Signed)
NEW MESSAGE   Pt states that he is returning a call to [facility]

## 2016-03-18 NOTE — Telephone Encounter (Signed)
Returned a call to the patient to offer appointment to follow up on sleep study. Patient informed me that he has appointment already scheduled.

## 2016-03-27 ENCOUNTER — Encounter: Payer: Self-pay | Admitting: Cardiovascular Disease

## 2016-03-27 ENCOUNTER — Ambulatory Visit (INDEPENDENT_AMBULATORY_CARE_PROVIDER_SITE_OTHER): Payer: 59 | Admitting: Cardiovascular Disease

## 2016-03-27 VITALS — BP 140/66 | HR 52 | Ht 76.0 in | Wt 190.0 lb

## 2016-03-27 DIAGNOSIS — R5382 Chronic fatigue, unspecified: Secondary | ICD-10-CM

## 2016-03-27 DIAGNOSIS — E039 Hypothyroidism, unspecified: Secondary | ICD-10-CM | POA: Diagnosis not present

## 2016-03-27 DIAGNOSIS — G4733 Obstructive sleep apnea (adult) (pediatric): Secondary | ICD-10-CM

## 2016-03-27 DIAGNOSIS — I491 Atrial premature depolarization: Secondary | ICD-10-CM

## 2016-03-27 DIAGNOSIS — I472 Ventricular tachycardia: Secondary | ICD-10-CM | POA: Diagnosis not present

## 2016-03-27 DIAGNOSIS — I4729 Other ventricular tachycardia: Secondary | ICD-10-CM

## 2016-03-27 NOTE — Patient Instructions (Signed)
Your physician wants you to follow-up in: 1 year or sooner if needed. You will receive a reminder letter in the mail two months in advance. If you don't receive a letter, please call our office to schedule the follow-up appointment.   If you need a refill on your cardiac medications before your next appointment, please call your pharmacy.   

## 2016-03-28 ENCOUNTER — Encounter: Payer: Self-pay | Admitting: Cardiovascular Disease

## 2016-03-28 DIAGNOSIS — G4733 Obstructive sleep apnea (adult) (pediatric): Secondary | ICD-10-CM | POA: Insufficient documentation

## 2016-03-28 NOTE — Progress Notes (Signed)
Patient ID: David Moreno, male   DOB: [birth year], 55 y.o.   MRN: [id number]    Primary M.D.: Dr. Antony Contras  HPI: David Moreno is a 55 y.o. male who presents for a 8 month follow-up cardiology evaluation and follow-up of his home sleep study.  Mr. David Moreno  works for [employer] in [employer]. He has a history of hypothyroidism and has experienced episodes of shortness of breath with activity and fatigue. He has been documented to have occasional PVCs as well as PACs during exercise. In September 2013 an echo Doppler study showed an ejection fraction greater than 55%. He had flat mitral valve closure without definitive prolapse, mild MR and mild TR without stenosis. On cardiac monitoring he was found to have 2 episodes of short-lived nonsustained ventricular tachycardia up to 10 beats which occurred during a period of increased stress as he was rushing to go to an airport. In the past, he has developed fatigue and Toprol was switched to  bystolic. I later reduced his bystolic dose from 5 mg to 2.5 mg for decreased energy he remains relatively asymptomatic with reference to sensation any palpitations.  He has been on Synthroid  0.1375 mg for hypothyroidism.  Over the past year he has felt well. He remained active. He plays tennis. He denies any further spells of exertionally precipitated shortness of breath and fatigue.  When I last saw him, he complained of increased fatigue.  He has a history of snoring and was  waking up 2-3 times per night.  His sleep was nonrestorative.  Several family members have sleep apnea.  He denies any episodes of presyncope or syncope.  I was concerned about obstructive sleep apnea and scheduled [facility]  Unfortunately, his insurance denied the lab sleep Center even though he had a history of nonsustained VT noted in the past.  He ultimately underwent a home sleep study.  Via David Moreno in  [place].  This was done on 11/06/2015 and demonstrated mild sleep apnea with an overall AHI 8.4.  His mean heart rate during sleep was 54%.  His oxygen nadir was 89%.  Percent of time less than 90% was 0.1% of the evening and lasted 30 seconds.  Percent of the evening with snoring was only 1.4%.  Following his sleep study, I did contact him and reviewed his findings with him in detail over the telephone.  At that point, he felt as though he was doing well.  His fatigue was improved and he wasn't aware of significant snoring.  He now presents for follow-up evaluation.   Past Medical History  Diagnosis Date  . Hypothyroidism   . NSVT (nonsustained ventricular tachycardia) [facility])     Nuclear stress test September 2013 was negative for ischemia. Exercise capacity 16 METS, low risk scan.  2-D echocardiogram September 2013 ejection fraction greater than 55%. No valvular disease noted..  . Acute appendicitis 2009  . Anxiety     Past Surgical History  Procedure Laterality Date  . Laparoscopic appendectomy  2009    No Known Allergies  Current Outpatient Prescriptions  Medication Sig Dispense Refill  . aspirin 81 MG tablet Take 81 mg by mouth daily.    Marland Kitchen BYSTOLIC 5 MG tablet TAKE HALF TABLET BY MOUTH DAILY 15 tablet 6  . escitalopram (LEXAPRO) 10 MG tablet Take 10 mg by mouth daily.    Marland Kitchen levothyroxine (SYNTHROID, LEVOTHROID) 137 MCG tablet Take  137 mcg by mouth daily before breakfast.    . zolpidem (AMBIEN) 10 MG tablet Take 10 mg by mouth at bedtime as needed for sleep. Takes 1/2 tablet prn     No current facility-administered medications for this visit.    Socially he is married and has 2 children. He is originally from [place]. He works in [employer] for [employer]. There is no tobacco use. Does drink occasional alcohol.  ROS General: Positive for fatigue; No fevers, chills, or night sweats;  HEENT: Negative; No changes in vision or hearing, sinus congestion, difficulty  swallowing Pulmonary: Negative; No cough, wheezing, shortness of breath, hemoptysis Cardiovascular: Negative; No chest pain, presyncope, syncope, he is unaware of any palpitations GI: Negative; No nausea, vomiting, diarrhea, or abdominal pain GU: Negative; No dysuria, hematuria, or difficulty voiding Musculoskeletal: Negative; no myalgias, joint pain, or weakness Hematologic/Oncology: Negative; no easy bruising, bleeding Endocrine: He has a history of hypothyroidism and is on Synthroid replacement no heat/cold intolerance; no diabetes Neuro: Negative; no changes in balance, headaches Skin: Negative; No rashes or skin lesions Psychiatric: Negative; No behavioral problems, depression Sleep: Positive for Mild sleep apnea with an AHI of 8.4 on a home sleep study.  no, bruxism, restless legs, hypnogognic hallucinations, no cataplexy Other comprehensive 14 point system review is negative.   PE BP 140/66 mmHg  Pulse 52  Ht '6\' 4"'  (1.93 m)  Wt 190 lb (86.183 kg)  BMI 23.14 kg/m2   Repeat blood pressure by me 122/70  Wt Readings from Last 3 Encounters:  03/27/16 190 lb (86.183 kg)  07/25/15 199 lb 9.6 oz (90.538 kg)  11/22/13 193 lb 8 oz (87.771 kg)   General: Alert, oriented, no distress.  Skin: normal turgor, no rashes HEENT: Normocephalic, atraumatic. Pupils round and reactive; sclera anicteric;no lid lag,  Nose without nasal septal hypertrophy Mouth/Parynx benign; Mallinpatti scale 2 Neck: No JVD, no carotid bruits with normal carotid upstroke Lungs: clear to ausculatation and percussion; no wheezing or rales Chest: No tenderness to palpation Heart: RR with occasional premature beats., s1 s2 norma;l 1/6 systolic murmur with an intermittent systolic click  Abdomen: soft, nontender; no hepatosplenomehaly, BS+; abdominal aorta nontender and not dilated by palpation. Back: No CVA tenderness Pulses 2+ Extremities: no clubbing cyanosis or edema, Homan's sign negative  Neurologic:  grossly nonfocal; cranial nerves normal Psychological: Normal affect and mood. Normal cognition.  ECG (independently read by me): Sinus bradycardia 52 bpm.  Occasional PACs.  QTc interval 392 ms  September 2016 ECG (independently read by me): Sinus bradycardia 57 bpm with sinus arrhythmia, 2 atrial premature beats.  Early repolarization.  July 2015 ECG (independently read by me and (atrial bigeminal rhythm with underlying sinus bradycardia.  He has a different morphology to his P wave with his atrial premature beat.  I did not see any clear-cut block T waves to suggest Mobitz rhythm.  Prior December 2014 ECG: Sinus rhythm at 52 beats per minute with one isolated atrial premature complex.  LABS:  BMP Latest Ref Rng 08/15/2015 06/26/2014 03/20/2008  Glucose 65 - 99 mg/dL 91 67(L) 122(H)  BUN 7 - 25 mg/dL '15 16 15  ' Creatinine 0.70 - 1.33 mg/dL 0.92 0.95 1.3  Sodium 135 - 146 mmol/L 139 141 139  Potassium 3.5 - 5.3 mmol/L 4.5 4.2 3.8  Chloride 98 - 110 mmol/L 103 103 102  CO2 20 - 31 mmol/L 29 28 -  Calcium 8.6 - 10.3 mg/dL 9.5 9.3 -   Hepatic Function Latest Ref Rng  08/15/2015 06/26/2014  Total Protein 6.1 - 8.1 g/dL 7.2 7.0  Albumin 3.6 - 5.1 g/dL 4.6 4.4  AST 10 - 35 U/L 21 20  ALT 9 - 46 U/L 16 15  Alk Phosphatase 40 - 115 U/L 50 51  Total Bilirubin 0.2 - 1.2 mg/dL 0.7 0.6   CBC Latest Ref Rng 08/15/2015 06/26/2014 03/20/2008  WBC 4.0 - 10.5 K/uL 5.0 5.7 -  Hemoglobin 13.0 - 17.0 g/dL 15.2 14.8 16.0  Hematocrit 39.0 - 52.0 % 45.8 43.4 47.0  Platelets 150 - 400 K/uL 188 196 -   Lab Results  Component Value Date   MCV 90.2 08/15/2015   MCV 86.6 06/26/2014   MCV 88.9 03/20/2008   Lab Results  Component Value Date   TSH 3.388 08/15/2015  No results found for: HGBA1C   Lipid Panel     Component Value Date/Time   CHOL 195 08/15/2015 0919   TRIG 106 08/15/2015 0919   HDL 58 08/15/2015 0919   CHOLHDL 3.4 08/15/2015 0919   VLDL 21 08/15/2015 0919   LDLCALC 116 08/15/2015 0919     RADIOLOGY: No results found.    ASSESSMENT AND PLAN: Mr. Vanoverbeke is a 55 year old white male who has documented normal systolic function with an ejection fraction >55% on echocardiography in September 2013.  Remotely, he had been demonstrated to have short-lived nonsustained ventricular tachycardic episodes up to 10 beats, which occurred during periods of increased stress.  A nuclear perfusion study demonstrated good exercise tolerance without ischemia.  His ECG today shows sinus bradycardia with an occasional PACs.  He continues to be on very low-dose Bystolic at just 2.5 mg.  His blood pressure is stable.  When I last saw him, I was concerned about sleep apnea.  His insurance would not allow him to do an in lab study.  As result, a home study was done and I extensively reviewed this with him today.  Unfortunately, I do not have information regarding sleep architecture, and percent from sleep and whether or not his sleep apnea is worse during grams sleep.  His fatigue has improved since I last saw him.  He admits to a 10 pound weight loss and is hardly snoring if at all.  On his home study.  He was only noted at 1.4% of the night with snoring episodes.  Since his sleep apnea is mild, and he is feeling well.  I have recommended continue surveillance.  We discussed potential customized oral sleep appliance versus CPAP therapy if symptoms progress.  Average or was again reviewed with him.  LDL cholesterol was 116.  He is on levothyroxine at 137 ug for hypothyroidism.  His TSH in September was 3.38.  As long as he is stable, I will see him in one year for reevaluation or sooner if problem arise.  Time spent: 25 minutes  Troy Sine, MD, [facility]  03/28/2016 5:45 PM

## 2016-09-23 ENCOUNTER — Other Ambulatory Visit: Payer: Self-pay | Admitting: Cardiovascular Disease

## 2017-04-01 ENCOUNTER — Encounter: Payer: Self-pay | Admitting: Cardiovascular Disease

## 2017-04-01 ENCOUNTER — Ambulatory Visit (INDEPENDENT_AMBULATORY_CARE_PROVIDER_SITE_OTHER): Payer: 59 | Admitting: Cardiovascular Disease

## 2017-04-01 VITALS — BP 122/70 | HR 59 | Ht 75.0 in | Wt 193.6 lb

## 2017-04-01 DIAGNOSIS — E039 Hypothyroidism, unspecified: Secondary | ICD-10-CM

## 2017-04-01 DIAGNOSIS — I491 Atrial premature depolarization: Secondary | ICD-10-CM | POA: Diagnosis not present

## 2017-04-01 DIAGNOSIS — G4733 Obstructive sleep apnea (adult) (pediatric): Secondary | ICD-10-CM | POA: Diagnosis not present

## 2017-04-01 DIAGNOSIS — R002 Palpitations: Secondary | ICD-10-CM | POA: Diagnosis not present

## 2017-04-01 NOTE — Progress Notes (Signed)
Patient ID: David Moreno, male   DOB: Mar 16, 1961, 56 y.o.   MRN: [id number]    Primary M.D.: Dr. Antony Contras  HPI: David Moreno is a 56 y.o. male who presents for a 12 month follow-up cardiology evaluation.  David Moreno  works for [employer] in [employer]. He has a history of hypothyroidism and has experienced episodes of shortness of breath with activity and fatigue. He has been documented to have occasional PVCs as well as PACs during exercise. In September 2013 an echo Doppler study showed an ejection fraction greater than 55%. He had flat mitral valve closure without definitive prolapse, mild MR and mild TR without stenosis. On cardiac monitoring he was found to have 2 episodes of short-lived nonsustained ventricular tachycardia up to 10 beats which occurred during a period of increased stress as he was rushing to go to an airport. In the past, he has developed fatigue and Toprol was switched to Bystolic. I later reduced his bystolic dose from 5 mg to 2.5 mg for decreased energy he remains relatively asymptomatic with reference to sensation any palpitations.  He has been on Synthroid  0.1375 mg for hypothyroidism.  Over the past year he has felt well. He remained active. He plays tennis. He denies any further spells of exertionally precipitated shortness of breath and fatigue.  When I  saw him oin 2017  he complained of increased fatigue.  He has a history of snoring and was  waking up 2-3 times per night.  His sleep was nonrestorative.  Several family members have sleep apnea.  He denies any episodes of presyncope or syncope.  I was concerned about obstructive sleep apnea and scheduled [facility]  His insurance denied the lab sleep Center even though he had a history of nonsustained VT noted in the past.  He ultimately underwent a home sleep study with [facility] in [place].  on 11/06/2015 and demonstrated mild sleep  apnea with an overall AHI 8.4.  His mean heart rate during sleep was 54%.  His oxygen nadir was 89%.  Percent of time less than 90% was 0.1% of the evening and lasted 30 seconds.  Percent of the evening with snoring was only 1.4%.  Following his sleep study, I did contact him and reviewed his findings with him in detail over the telephone.  At that point, he felt as though he was doing well.  His fatigue was improved and he wasn't aware of significant snoring.    When I last saw him, I reviewed his sleep findings in detail.  Over the past year, he has remained stable.  He denies any episodes of chest pain.  He is been playing tennis on average 2 times per week.  He developed a right knee effusion and underwent fluid removal last week.  He denies any palpitations.  He denies any episodes of presyncope or syncope..  He believes he is sleeping well.  He wakes up 1 time for nocturia.  He is sleeping on average 7-8 hours per night.  He now presents for follow-up evaluation.   Past Medical History:  Diagnosis Date  . Acute appendicitis 2009  . Anxiety   . Hypothyroidism   . NSVT (nonsustained ventricular tachycardia) [facility])    Nuclear stress test September 2013 was negative for ischemia. Exercise capacity 16 METS, low risk scan.  2-D echocardiogram September 2013 ejection fraction greater than 55%. No valvular disease  noted..    Past Surgical History:  Procedure Laterality Date  . LAPAROSCOPIC APPENDECTOMY  2009    No Known Allergies  Current Outpatient Prescriptions  Medication Sig Dispense Refill  . aspirin 81 MG tablet Take 81 mg by mouth daily.    Marland Kitchen BYSTOLIC 5 MG tablet TAKE HALF TABLET BY MOUTH DAILY 15 tablet 6  . escitalopram (LEXAPRO) 10 MG tablet Take 10 mg by mouth daily.    Marland Kitchen levothyroxine (SYNTHROID, LEVOTHROID) 137 MCG tablet Take 137 mcg by mouth daily before breakfast.    . zolpidem (AMBIEN) 10 MG tablet Take 10 mg by mouth at bedtime as needed for sleep. Takes 1/2 tablet prn      No current facility-administered medications for this visit.     Socially he is married and has 2 children. He is originally from [place]. He works in [employer] for [employer]. There is no tobacco use. Does drink occasional alcohol.  ROS General: Positive for fatigue; No fevers, chills, or night sweats;  HEENT: Negative; No changes in vision or hearing, sinus congestion, difficulty swallowing Pulmonary: Negative; No cough, wheezing, shortness of breath, hemoptysis Cardiovascular: Negative; No chest pain, presyncope, syncope, he is unaware of any palpitations GI: Negative; No nausea, vomiting, diarrhea, or abdominal pain GU: Negative; No dysuria, hematuria, or difficulty voiding Musculoskeletal: Negative; no myalgias, joint pain, or weakness Hematologic/Oncology: Negative; no easy bruising, bleeding Endocrine: He has a history of hypothyroidism and is on Synthroid replacement no heat/cold intolerance; no diabetes Neuro: Negative; no changes in balance, headaches Skin: Negative; No rashes or skin lesions Psychiatric: Negative; No behavioral problems, depression Sleep: Positive for Mild sleep apnea with an AHI of 8.4 on a home sleep study.  no, bruxism, restless legs, hypnogognic hallucinations, no cataplexy Other comprehensive 14 point system review is negative.   PE BP 122/70   Pulse (!) 59   Ht '6\' 3"'  (1.905 m)   Wt 193 lb 9.6 oz (87.8 kg)   BMI 24.20 kg/m    Repeat blood pressure by me 118/70 supine and 112/70 standing.  Wt Readings from Last 3 Encounters:  04/01/17 193 lb 9.6 oz (87.8 kg)  03/27/16 190 lb (86.2 kg)  07/25/15 199 lb 9.6 oz (90.5 kg)   General: Alert, oriented, no distress.  Skin: normal turgor, no rashes HEENT: Normocephalic, atraumatic. Pupils round and reactive; sclera anicteric;no lid lag,  Nose without nasal septal hypertrophy Mouth/Parynx benign; Mallinpatti scale 2 Neck: No JVD, no carotid bruits with normal carotid upstroke Lungs: clear to  ausculatation and percussion; no wheezing or rales Chest: No tenderness to palpation Heart: RR with occasional premature beats., s1 s2 norma;l 1/6 systolic murmur with an intermittent systolic click; No diastolic murmur.  No rubs thrills or heaves. Abdomen: soft, nontender; no hepatosplenomehaly, BS+; abdominal aorta nontender and not dilated by palpation. Back: No CVA tenderness Pulses 2+ Extremities: no clubbing cyanosis or edema, Homan's sign negative  Neurologic: grossly nonfocal; cranial nerves normal Psychological: Normal affect and mood. Normal cognition.  ECG (independently read by me): Sinus bradycardia with sinus arrhythmia and PAC.  PR interval 168 ms; QTc interval 390 ms  April 2017 ECG (independently read by me): Sinus bradycardia 52 bpm.  Occasional PACs.  QTc interval 392 ms  September 2016 ECG (independently read by me): Sinus bradycardia 57 bpm with sinus arrhythmia, 2 atrial premature beats.  Early repolarization.  July 2015 ECG (independently read by me and (atrial bigeminal rhythm with underlying sinus bradycardia.  He has a different morphology to  his P wave with his atrial premature beat.  I did not see any clear-cut block T waves to suggest Mobitz rhythm.  Prior December 2014 ECG: Sinus rhythm at 52 beats per minute with one isolated atrial premature complex.  LABS:  BMP Latest Ref Rng & Units 08/15/2015 06/26/2014 03/20/2008  Glucose 65 - 99 mg/dL 91 67(L) 122(H)  BUN 7 - 25 mg/dL '15 16 15  ' Creatinine 0.70 - 1.33 mg/dL 0.92 0.95 1.3  Sodium 135 - 146 mmol/L 139 141 139  Potassium 3.5 - 5.3 mmol/L 4.5 4.2 3.8  Chloride 98 - 110 mmol/L 103 103 102  CO2 20 - 31 mmol/L 29 28 -  Calcium 8.6 - 10.3 mg/dL 9.5 9.3 -   Hepatic Function Latest Ref Rng & Units 08/15/2015 06/26/2014  Total Protein 6.1 - 8.1 g/dL 7.2 7.0  Albumin 3.6 - 5.1 g/dL 4.6 4.4  AST 10 - 35 U/L 21 20  ALT 9 - 46 U/L 16 15  Alk Phosphatase 40 - 115 U/L 50 51  Total Bilirubin 0.2 - 1.2 mg/dL 0.7 0.6    CBC Latest Ref Rng & Units 08/15/2015 06/26/2014 03/20/2008  WBC 4.0 - 10.5 K/uL 5.0 5.7 -  Hemoglobin 13.0 - 17.0 g/dL 15.2 14.8 16.0  Hematocrit 39.0 - 52.0 % 45.8 43.4 47.0  Platelets 150 - 400 K/uL 188 196 -   Lab Results  Component Value Date   MCV 90.2 08/15/2015   MCV 86.6 06/26/2014   MCV 88.9 03/20/2008   Lab Results  Component Value Date   TSH 3.388 08/15/2015  No results found for: HGBA1C   Lipid Panel     Component Value Date/Time   CHOL 195 08/15/2015 0919   TRIG 106 08/15/2015 0919   HDL 58 08/15/2015 0919   CHOLHDL 3.4 08/15/2015 0919   VLDL 21 08/15/2015 0919   LDLCALC 116 08/15/2015 0919    RADIOLOGY: No results found.  IMPRESSION: 1. Heart palpitations   2. PAC (premature atrial contraction)   3. Mild OSA (obstructive sleep apnea)   4. Hypothyroidism, unspecified type     ASSESSMENT AND PLAN: Mr. Pendry is a 56 year old white male who has documented normal systolic function with an ejection fraction >55% on echocardiography in September 2013.  Remotely, he had been demonstrated to have short-run of  nonsustained ventricular tachycardic episodes up to 10 beats, which occurred during periods of increased stress.  A nuclear perfusion study demonstrated good exercise tolerance without ischemia.  He continues to be on very low-dose Bystolic at just 2.5 mg. .  His ECG today shows sinus bradycardia at 59 bpm with sinus arrhythmia and an isolated PAC.  His blood pressure is stable and is without orthostatic change.  I again reviewed his sleep study.  Since it was a home study I do not have information regarding sleep architecture, and percent from sleep and whether or not his sleep apnea is worse during REM sleep.  However, he did not have significant oxygen desaturation and only had 0.1% of the night with O2 saturation less than 90% argue against significant REM related sleep disorder.  His fatigue has improved since I last saw him.  He just saw his primary physician,  Dr. Rockwell Germany and underwent a complete set of laboratory.  He tells me his cholesterol was 180.  His TSH was normal on his current dose of levothyroxine for hypothyroidism.  He continues to exercise regularly.  He will continue current medical therapy.  I will see him in one  year for reevaluation.     Troy Sine, MD, [facility]  04/01/2017 9:16 AM

## 2017-04-01 NOTE — Patient Instructions (Signed)
Your physician wants you to follow-up in: 1 year or sooner if needed. You will receive a reminder letter in the mail two months in advance. If you don't receive a letter, please call our office to schedule the follow-up appointment.   If you need a refill on your cardiac medications before your next appointment, please call your pharmacy.   

## 2017-04-07 ENCOUNTER — Other Ambulatory Visit: Payer: Self-pay | Admitting: Orthopaedic Surgery

## 2017-04-07 DIAGNOSIS — M25561 Pain in right knee: Secondary | ICD-10-CM

## 2017-04-19 ENCOUNTER — Ambulatory Visit
Admission: RE | Admit: 2017-04-19 | Discharge: 2017-04-19 | Disposition: A | Discharge status: Home / Self Care | Payer: 59 | Source: Ambulatory Visit | Attending: Orthopaedic Surgery | Admitting: Orthopaedic Surgery

## 2017-04-19 DIAGNOSIS — M25561 Pain in right knee: Secondary | ICD-10-CM

## 2017-05-05 ENCOUNTER — Other Ambulatory Visit: Payer: Self-pay | Admitting: Cardiovascular Disease

## 2017-11-17 ENCOUNTER — Other Ambulatory Visit: Payer: Self-pay | Admitting: Cardiovascular Disease

## 2017-11-18 NOTE — Telephone Encounter (Signed)
Rx has been sent to the pharmacy electronically. ° °

## 2017-12-21 ENCOUNTER — Other Ambulatory Visit: Payer: Self-pay | Admitting: Family Medicine

## 2017-12-21 ENCOUNTER — Ambulatory Visit
Admission: RE | Admit: 2017-12-21 | Discharge: 2017-12-21 | Disposition: A | Discharge status: Home / Self Care | Payer: 59 | Source: Ambulatory Visit | Attending: Family Medicine | Admitting: Family Medicine

## 2017-12-21 DIAGNOSIS — N50811 Right testicular pain: Secondary | ICD-10-CM

## 2018-03-03 ENCOUNTER — Other Ambulatory Visit: Payer: Self-pay | Admitting: Cardiovascular Disease

## 2018-04-18 NOTE — Progress Notes (Signed)
Cardiology Office Note   Date:  04/19/2018   ID:  David, Moreno [birth year], MRN [id number]  PCP:  Tally Joe, MD  Cardiologist: Dr. Tresa Endo  Chief Complaint  Patient presents with  . Palpitations    discuss medications     History of Present Illness: David Moreno is a 57 y.o. male who presents for ongoing assessment and management of heart palpitations, PACs, mild obstructive sleep apnea with history of hypothyroidism.  The patient works for [employer].  The patient has had a remote history of nonsustained ventricular tachycardic episodes up to 10 beats which occurred during episodes of increased personal stress.  The patient is on a low-dose beta-blocker Bystolic 2.5 mg daily.  The patient did have a home sleep study completed that revealed mild OSA and did not require CPAP.   He comes today with concerns about his heart rate being too low.  He has been seen by his primary care physician Dr. Erling Conte and it noted that his heart rate was in the 40s and 50s.  He has athletic playing tennis 3-4 times a week and also running.  He denies any recurrent palpitations on the Bystolic but is been noticing more fatigue.  Due to his hypothyroidism the patient is also being followed and is been on thyroid medication with follow-up labs which have been normal.  He was requesting a trial of stopping the Bystolic to see if he feels better having less fatigue.  He does not drink caffeine or use any substances which are stimulants.  He denies dizziness, chest pain, dyspnea on exertion, or near syncope.  He remains active.  Past Medical History:  Diagnosis Date  . Acute appendicitis 2009  . Anxiety   . Hypothyroidism   . NSVT (nonsustained ventricular tachycardia) [facility])    Nuclear stress test September 2013 was negative for ischemia. Exercise capacity 16 METS, low risk scan.  2-D echocardiogram September 2013 ejection fraction greater than 55%. No valvular disease noted..    Past  Surgical History:  Procedure Laterality Date  . LAPAROSCOPIC APPENDECTOMY  2009     Current Outpatient Medications  Medication Sig Dispense Refill  . aspirin 81 MG tablet Take 81 mg by mouth daily.    Marland Kitchen BYSTOLIC 5 MG tablet TAKE HALF TABLET BY MOUTH DAILY 15 tablet 2  . escitalopram (LEXAPRO) 10 MG tablet Take 10 mg by mouth daily.    Marland Kitchen levothyroxine (SYNTHROID, LEVOTHROID) 137 MCG tablet Take 137 mcg by mouth daily before breakfast.    . zolpidem (AMBIEN) 10 MG tablet Take 10 mg by mouth at bedtime as needed for sleep. Takes 1/2 tablet prn     No current facility-administered medications for this visit.     Allergies:   Patient has no known allergies.    Social History:  The patient  reports that he has never smoked. He has never used smokeless tobacco. He reports that he drinks alcohol. He reports that he does not use drugs.   Family History:  The patient's family history includes Heart attack in his mother; Heart disease in his maternal grandmother.    ROS: All other systems are reviewed and negative. Unless otherwise mentioned in H&P    PHYSICAL EXAM: VS:  Ht 6' 3.5" (1.918 m)   Wt 197 lb 12.8 oz (89.7 kg)   BMI 24.40 kg/m  , BMI Body mass index is 24.4 kg/m. GEN: Well nourished, well developed, in no acute distress  HEENT: normal  Neck: no  JVD, carotid bruits, or masses Cardiac: RRR; no murmurs, rubs, or gallops,no edema  Respiratory:  clear to auscultation bilaterally, normal work of breathing GI: soft, nontender, nondistended, + BS MS: no deformity or atrophy  Skin: warm and dry, no rash Neuro:  Strength and sensation are intact Psych: euthymic mood, full affect   EKG: Sinus bradycardia heart rate of 51 bpm with sinus arrhythmia.  Normal intervals.  Recent Labs: No results found for requested labs within last 8760 hours.    Lipid Panel    Component Value Date/Time   CHOL 195 08/15/2015 0919   TRIG 106 08/15/2015 0919   HDL 58 08/15/2015 0919   CHOLHDL  3.4 08/15/2015 0919   VLDL 21 08/15/2015 0919   LDLCALC 116 08/15/2015 0919      Wt Readings from Last 3 Encounters:  04/19/18 197 lb 12.8 oz (89.7 kg)  04/01/17 193 lb 9.6 oz (87.8 kg)  03/27/16 190 lb (86.2 kg)      Other studies Reviewed: Echocardiogram Sep 18, 2012 Normal LV function > 55%. Flat MV closure without definitve prolapse, mild MR and TR.   ASSESSMENT AND PLAN:  1.  PSVT: Patient has history and has been on Bystolic 2.5 mg daily.  He has not had any breakthrough palpitations or rapid heart rhythm.  In fact he has been noticing that his heart rate has been lower and he has been more fatigued.  After being seen by primary care physician it was recommended that he speak with cardiology to ascertain whether he could stop the Bystolic.  As the patient is feeling more fatigued I will give him a trial of stopping Bystolic by weaning him to every other day for a few days and then stopping altogether.  He is on such a low dose of the beta-blocker I do not feel stopping it over a couple of days would be an issue.  The patient is going to report to [place] if he has repeated palpitations or recurrent SVT, rapid heart rhythm diastolic.  He is going to follow-up with [place] in 6 months to ascertain how he is doing I do not want to put him on the monitor on him at this time unless he becomes symptomatic.  No new labs as they have recently been drawn by PCP.  Did tell him he may notice that his blood pressure will rise slightly as he will be off medication.   2.  Hypothyroidism: Followed by PCP.   Current medicines are reviewed at length with the patient today.    Labs/ tests ordered today include: None  Bettey Mare. Liborio Nixon, ANP, AACC   04/19/2018 8:04 AM    [facility] [address], [place], [place] [address] Phone: [phone]; Fax: [phone]

## 2018-04-19 ENCOUNTER — Encounter: Payer: Self-pay | Admitting: Adult Health

## 2018-04-19 ENCOUNTER — Ambulatory Visit: Payer: 59 | Admitting: Adult Health

## 2018-04-19 VITALS — BP 112/62 | HR 53 | Ht 75.5 in | Wt 197.8 lb

## 2018-04-19 DIAGNOSIS — R002 Palpitations: Secondary | ICD-10-CM | POA: Diagnosis not present

## 2018-04-19 DIAGNOSIS — Z79899 Other long term (current) drug therapy: Secondary | ICD-10-CM | POA: Diagnosis not present

## 2018-04-19 NOTE — Patient Instructions (Signed)
Medication Instructions:  Take- Bystolic 5 mg every other day for 2 days then stop.    Follow-Up: Your physician wants you to follow-up in: 6 months with Dr. Carmin Richmond will receive a reminder letter in the mail two months in advance. If you don't receive a letter, please call our office to schedule the follow-up appointment.   Any Other Special Instructions Will Be Listed Below (If Applicable).     If you need a refill on your cardiac medications before your next appointment, please call your pharmacy.

## 2018-05-12 ENCOUNTER — Telehealth: Payer: Self-pay | Admitting: Cardiovascular Disease

## 2018-05-12 NOTE — Telephone Encounter (Signed)
New Message:          [facility] Pre-operative Risk Assessment    Request for surgical clearance:  1. What type of surgery is being performed? Left parotid ectomy   2. When is this surgery scheduled? 06/04/18   3. What type of clearance is required (medical clearance vs. Pharmacy clearance to hold med vs. Both)? Cardiac clearance  4. Are there any medications that need to be held prior to surgery and how long? aspirin 81 MG tablet and any others that should be held  5. Practice name and name of physician performing surgery? Dr. Orpah Greek Bates/ [facility]   6. What is your office phone number [phone]   7.   What is your office fax number [phone]  8.   Anesthesia type (None, local, MAC, general) ? General   Maricela Bo Pulliam 05/12/2018, 4:38 PM  _________________________________________________________________   (provider comments below)

## 2018-05-13 NOTE — Telephone Encounter (Signed)
   Primary Cardiologist: Thomas Kelly, MD  Chart reNicki Guadalajaraviewed as part of pre-operative protocol coverage. H/o palpitations, PACs, OSA, anxiety, hypothyroidism, NSVT (2 episodes up to 10 beats on remote monitoring). Normal nuc 2013 with exercise capacity 16 METS. Samara DeistKathryn Lawrence's most recent note contains PSVT in the assessment and plan but I do not see further details on this diagnosis. He was not having any breakthrough rhythms and bystolic was weaned then stopped due to fatigue. I was able to contact patient today and he affirms he's feeling great without any chest pain, SOB, palpitations. He actually feels much better since stopping the Bystolic. As he has no known history of TIA, stroke or CAD, would be OK to hold aspirin in anticipation of surgery, usually 5-7 days prior to procedure - will defer to surgeon to decide.  Therefore, based on ACC/AHA guidelines, the patient would be at acceptable risk for the planned procedure without further cardiovascular testing.   I will route this recommendation to the requesting party via Epic fax function and remove from pre-op pool.  Please call with questions.  Laurann Montanaayna N Verneice Caspers, PA-C 05/13/2018, 4:46 PM

## 2018-06-06 ENCOUNTER — Other Ambulatory Visit: Payer: Self-pay | Admitting: Cardiovascular Disease

## 2018-10-18 ENCOUNTER — Ambulatory Visit: Payer: 59 | Admitting: Physician Assistant

## 2018-10-18 ENCOUNTER — Encounter: Payer: Self-pay | Admitting: Physician Assistant

## 2018-10-18 VITALS — BP 128/76 | HR 54 | Ht 75.0 in | Wt 193.8 lb

## 2018-10-18 DIAGNOSIS — R002 Palpitations: Secondary | ICD-10-CM | POA: Diagnosis not present

## 2018-10-18 DIAGNOSIS — I491 Atrial premature depolarization: Secondary | ICD-10-CM | POA: Diagnosis not present

## 2018-10-18 NOTE — Progress Notes (Signed)
Cardiology Office Note   Date:  10/18/2018   ID:  David Moreno, David Moreno [birth year], MRN [id number]  PCP:  Tally Joe, MD Cardiologist:  Nicki Guadalajara, MD 04/19/2018 Theodore Demark, PA-C   No chief complaint on file.   History of Present Illness: [name] is a 57 y.o. male with a history of palpitations, PACs and brief NSVT, mild OSA not on CPAP, hypothyroid, negative exercise Myoview 2013, did not tolerate Bystolic due to fatigue  David Moreno presents for cardiology follow up.  He exercises, plays tennis and exercises in the gym. Runs occasionally.   Never gets chest pain or SOB w/ exercise.   He has had skips for so long, they don't bother him. He only notices them when he is lying quietly. Never feels them w/ exercise. No hx presyncope or syncope.   Notes his HR is low at rest, but no sx w/ that. The NSVT in the past was when he was at the airport and his flight was canceled.  He was aware of that at the time, but it did not last very long and he did not feel any presyncope or syncope.  His hands and feet get cold.  He wonders if the circulation to his hands and legs is adequate.  However, they never turn white.  They never get numb.  He has never noticed a demarcation line.   Past Medical History:  Diagnosis Date  . Acute appendicitis 2009  . Anxiety   . Hypothyroidism   . NSVT (nonsustained ventricular tachycardia) [facility])    Nuclear stress test September 2013 was negative for ischemia. Exercise capacity 16 METS, low risk scan.  2-D echocardiogram September 2013 ejection fraction greater than 55%. No valvular disease noted..    Past Surgical History:  Procedure Laterality Date  . LAPAROSCOPIC APPENDECTOMY  2009    Current Outpatient Medications  Medication Sig Dispense Refill  . aspirin 81 MG tablet Take 81 mg by mouth daily.    Marland Kitchen escitalopram (LEXAPRO) 10 MG tablet Take 10 mg by mouth daily.    Marland Kitchen levothyroxine (SYNTHROID, LEVOTHROID) 137 MCG  tablet Take 137 mcg by mouth daily before breakfast.    . zolpidem (AMBIEN) 10 MG tablet Take 10 mg by mouth at bedtime as needed for sleep. Takes 1/2 tablet prn     No current facility-administered medications for this visit.     Allergies:   Patient has no known allergies.    Social History:  The patient  reports that he has never smoked. He has never used smokeless tobacco. He reports that he drinks alcohol. He reports that he does not use drugs.   Family History:  The patient's family history includes Heart attack in his mother; Heart disease in his maternal grandmother.  He indicated that his mother is deceased. He indicated that his father is alive. He indicated that both of his brothers are alive.    ROS:  Please see the history of present illness. All other systems are reviewed and negative.    PHYSICAL EXAM: VS:  BP 128/76   Pulse (!) 54   Ht 6\' 3"  (1.905 m)   Wt 193 lb 12.8 oz (87.9 kg)   BMI 24.22 kg/m  , BMI Body mass index is 24.22 kg/m. GEN: Well nourished, well developed, male in no acute distress HEENT: normal for age  Neck: no JVD, no carotid bruit, no masses Cardiac: RRR; no murmur, no rubs, or gallops Respiratory:  clear to  auscultation bilaterally, normal work of breathing GI: soft, nontender, nondistended, + BS MS: no deformity or atrophy; no edema; distal pulses are 2+ in all 4 extremities  Skin: warm and dry, no rash Neuro:  Strength and sensation are intact Psych: euthymic mood, full affect   EKG:  EKG is ordered today. The ekg ordered today demonstrates sinus bradycardia, heart rate 54, early repolarization is noted, PACs are noted in a bigeminal pattern  ECHO: 09/04/2012 [facility] EF greater than 55%, RV function normal, no valvular abnormalities   MYOVIEW: 08/17/2012 Normal perfusion in all areas at rest and with exertion Normal EF Rate related right bundle branch block noted Exercise capacity, 16 METS   Recent  Labs: No results found for requested labs within last 8760 hours.  CBC    Component Value Date/Time   WBC 5.0 08/15/2015 0919   RBC 5.08 08/15/2015 0919   HGB 15.2 08/15/2015 0919   HCT 45.8 08/15/2015 0919   PLT 188 08/15/2015 0919   MCV 90.2 08/15/2015 0919   MCH 29.9 08/15/2015 0919   MCHC 33.2 08/15/2015 0919   RDW 14.2 08/15/2015 0919   LYMPHSABS 0.7 03/20/2008 2103   MONOABS 0.7 03/20/2008 2103   EOSABS 0.0 03/20/2008 2103   BASOSABS 0.0 03/20/2008 2103   CMP Latest Ref Rng & Units 08/15/2015 06/26/2014 03/20/2008  Glucose 65 - 99 mg/dL 91 16(X) 096(E)  BUN 7 - 25 mg/dL 15 16 15   Creatinine 0.70 - 1.33 mg/dL 4.54 0.98 1.3  Sodium 119 - 146 mmol/L 139 141 139  Potassium 3.5 - 5.3 mmol/L 4.5 4.2 3.8  Chloride 98 - 110 mmol/L 103 103 102  CO2 20 - 31 mmol/L 29 28 -  Calcium 8.6 - 10.3 mg/dL 9.5 9.3 -  Total Protein 6.1 - 8.1 g/dL 7.2 7.0 -  Total Bilirubin 0.2 - 1.2 mg/dL 0.7 0.6 -  Alkaline Phos 40 - 115 U/L 50 51 -  AST 10 - 35 U/L 21 20 -  ALT 9 - 46 U/L 16 15 -     Lipid Panel by PCP, 03/25/2018 Component Value   CHOL  176   TRIG  35   HDL  63   CHOLHDL    VLDL     LDLCALC  105     Wt Readings from Last 3 Encounters:  10/18/18 193 lb 12.8 oz (87.9 kg)  04/19/18 197 lb 12.8 oz (89.7 kg)  04/01/17 193 lb 9.6 oz (87.8 kg)     Other studies Reviewed: Additional studies/ records that were reviewed today include: Office notes and testing.  ASSESSMENT AND PLAN:  1.  Palpitations: He has had them for so long that he does not notice them except when he is at rest.  He does not notice them when he is exercising -No history of presyncope or syncope -Intolerant to beta-blockers with a low resting heart rate and fatigue -No med changes or testing indicated    Current medicines are reviewed at length with the patient today.  The patient does not have concerns regarding medicines.  The following changes have been made:  no change  Labs/ tests ordered today  include:  No orders of the defined types were placed in this encounter.    Disposition:   FU with Nicki Guadalajara, MD in 1 year  Signed, Theodore Demark, PA-C  10/18/2018 10:17 AM    [facility] Phone: [phone]; Fax: [phone]  This note was written with the  assistance of speech recognition software.  Please excuse any transcriptional errors.

## 2018-10-18 NOTE — Patient Instructions (Signed)
Medication Instructions:  Your physician recommends that you continue on your current medications as directed. Please refer to the Current Medication list given to you today. If you need a refill on your cardiac medications before your next appointment, please call your pharmacy.   Lab work: None  If you have labs (blood work) drawn today and your tests are completely normal, you will receive your results only by: . MyChart Message (if you have MyChart) OR . A paper copy in the mail If you have any lab test that is abnormal or we need to change your treatment, we will call you to review the results.  Testing/Procedures: None   Follow-Up: At [facility], you and your health needs are our priority.  As part of our continuing mission to provide you with exceptional heart care, we have created designated Provider Care Teams.  These Care Teams include your primary Cardiologist (physician) and Advanced Practice Providers (APPs -  Physician Assistants and Nurse Practitioners) who all work together to provide you with the care you need, when you need it. You will need a follow up appointment in 12 months.  Please call our office 2 months in advance to schedule this appointment.  You may see Thomas Kelly, MD or one of the following Advanced Practice Providers on your designated Care Team: Hao Meng, PA-C . Angela Duke, PA-C  Any Other Special Instructions Will Be Listed Below (If Applicable).   

## 2019-08-05 IMAGING — US US SCROTUM W/ DOPPLER COMPLETE
1 series · 13 of 25 positions shown · non-contrast
Comparison: None.

CLINICAL DATA: Testicular pain for 1 week.

EXAM:
SCROTAL ULTRASOUND
DOPPLER ULTRASOUND OF THE TESTICLES
TECHNIQUE: Complete ultrasound examination of the testicles, epididymis, and
other scrotal structures was performed. Color and spectral Doppler
ultrasound were also utilized to evaluate blood flow to the
testicles.

[Series 1: us scrotum w/ doppler complete · 0.07mm/px · 13 of 42 slices shown]
[im 1/42]
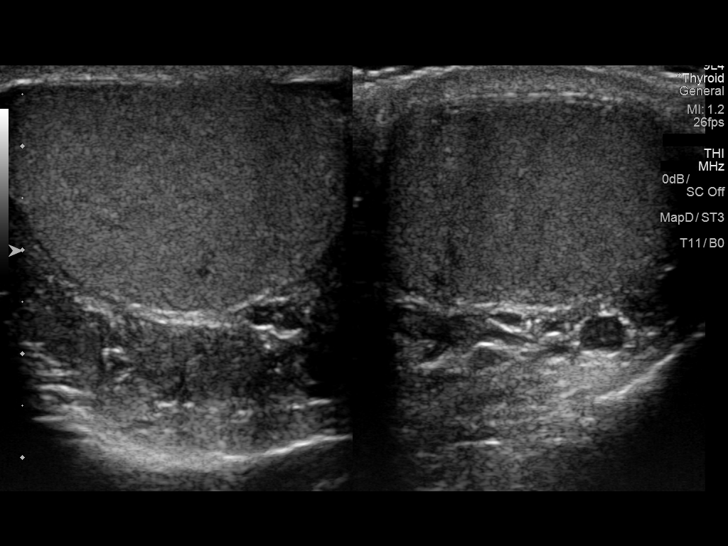
[im 4/42]
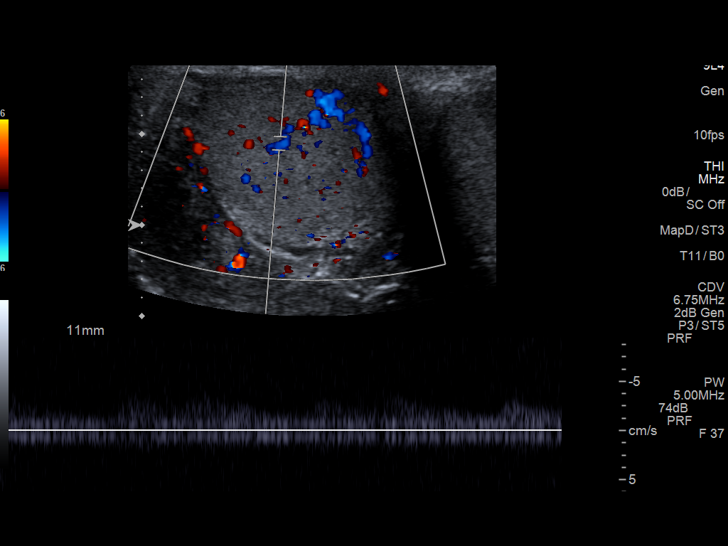
[im 7/42]
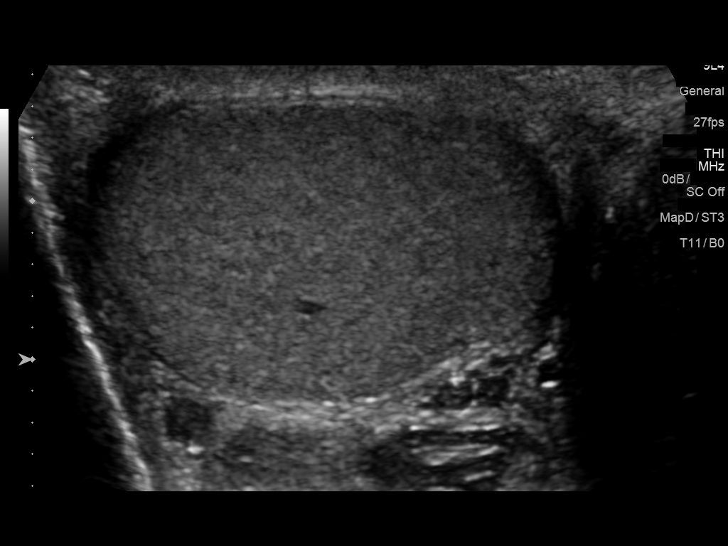
[im 11/42]
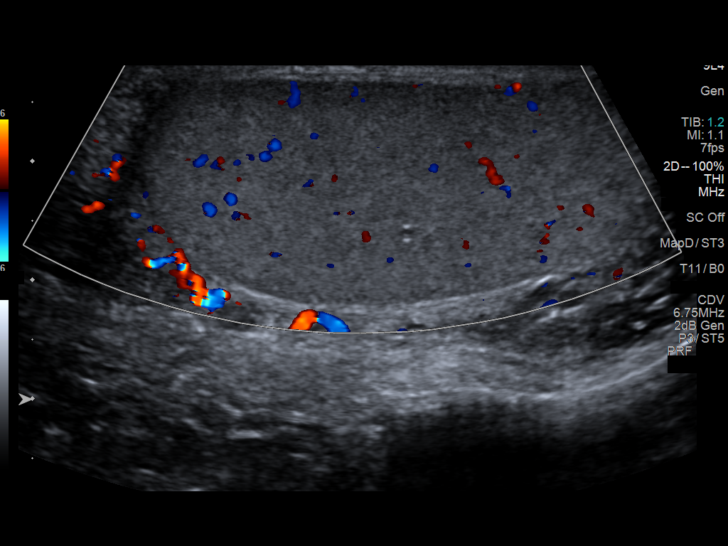
[im 14/42]
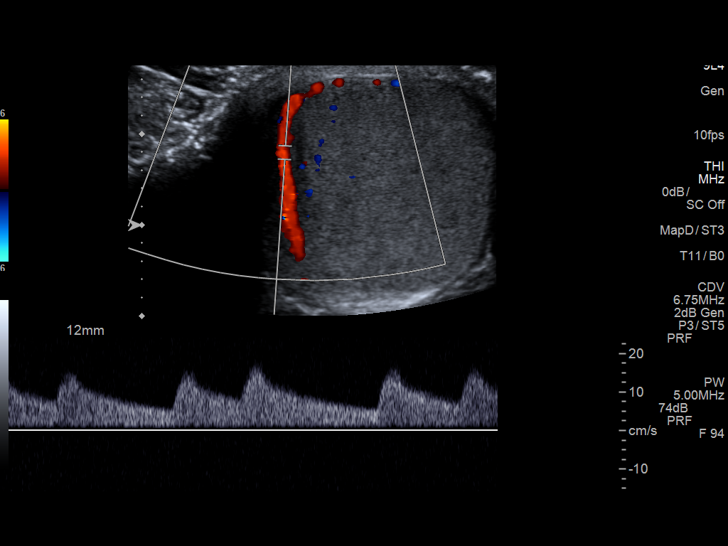
[im 18/42]
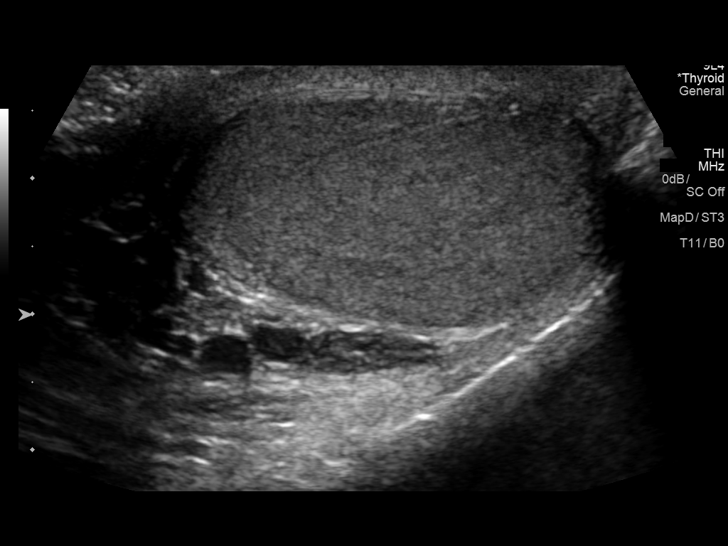
[im 21/42]
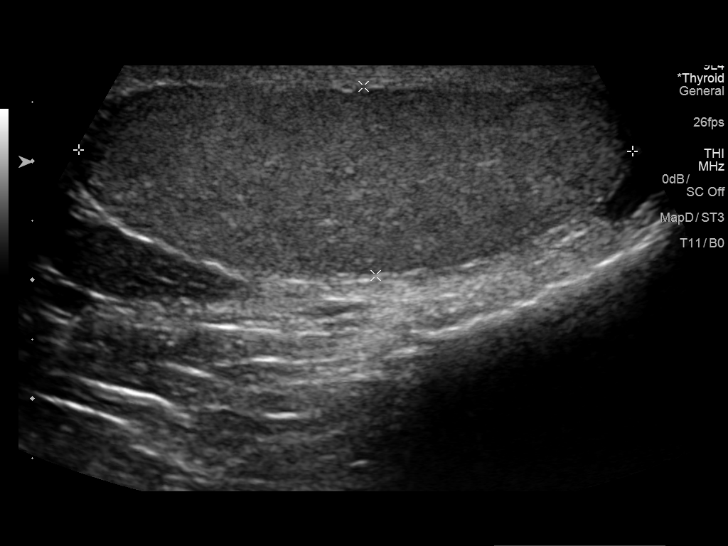
[im 24/42]
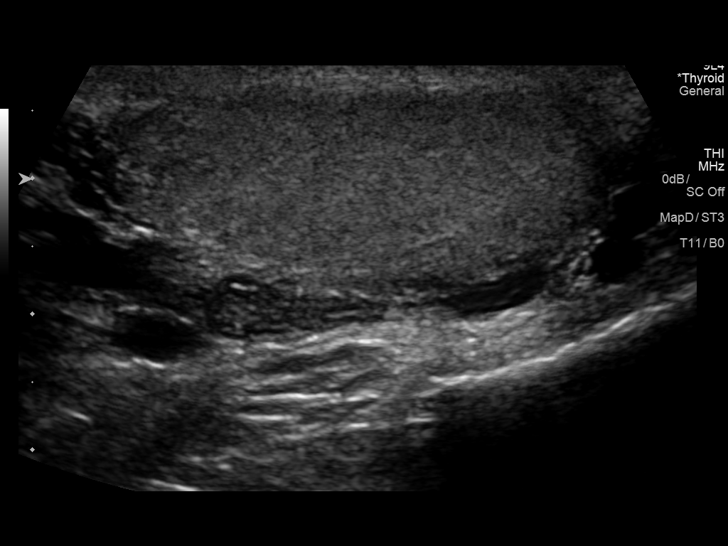
[im 28/42]
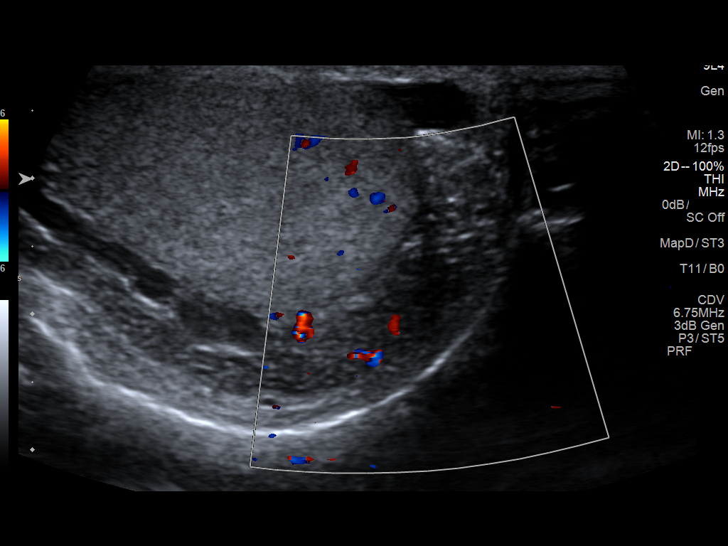
[im 31/42]
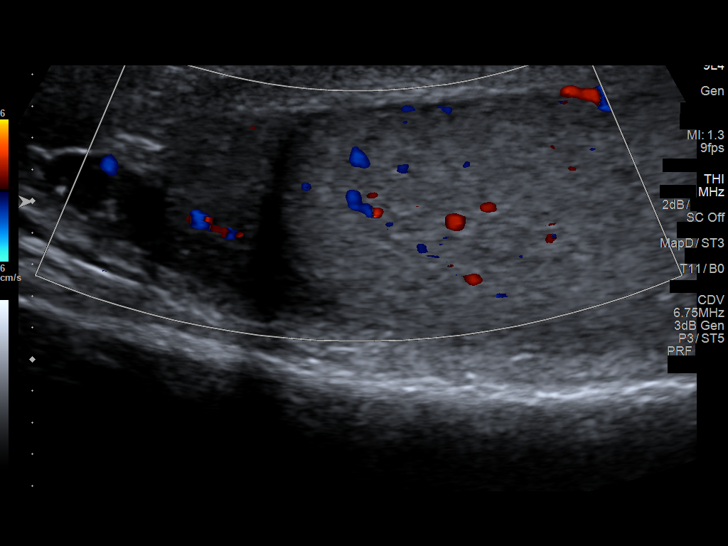
[im 35/42]
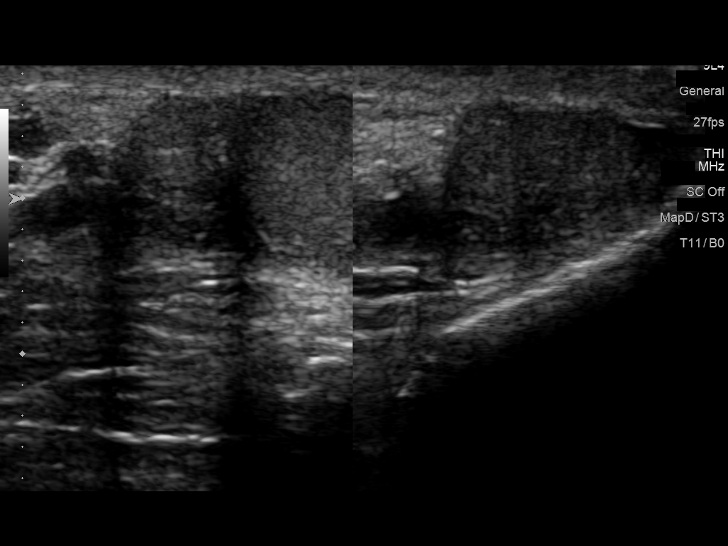
[im 38/42]
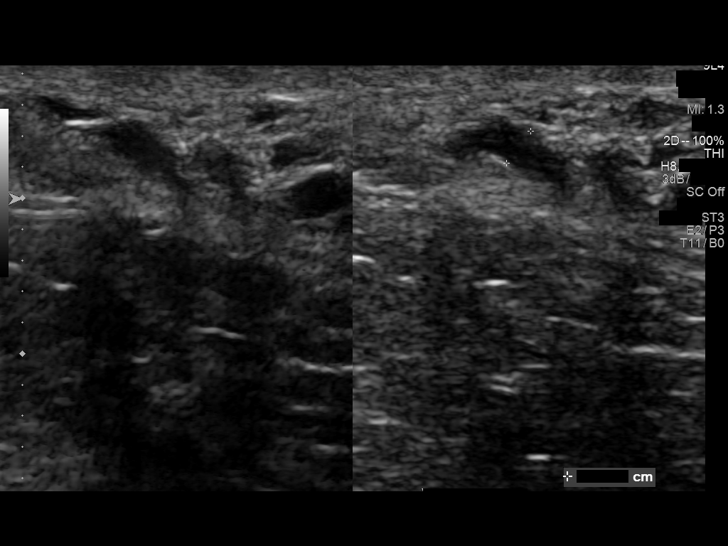
[im 42/42]
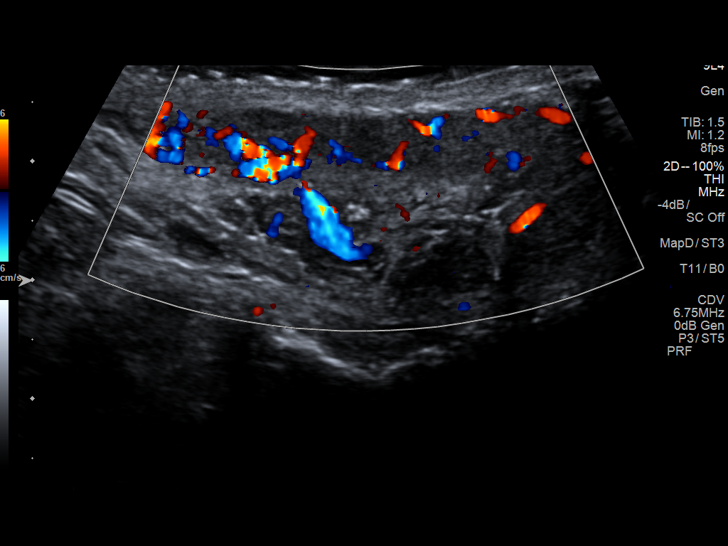

[13 of 25 positions shown; findings below may reference images not displayed]

FINDINGS: Right testicle

Measurements: Normal in size and homogeneous echotexture measuring
4.6 x 1.7 x 2.9 cm. No mass or microlithiasis visualized.

Left testicle

Measurements: Normal size and homogeneous in echotexture measuring
4.7 x 1.6 x 3.0 cm.. No mass or microlithiasis visualized.

Right epididymis: RIGHT epididymis is hyperemic compared to the
testicle vasculature. Epididymis is prominent.

Left epididymis:  Normal in size and appearance.

Hydrocele:  None visualized.

Varicocele:  None visualized.

Pulsed Doppler interrogation of both testes demonstrates normal low
resistance arterial and venous waveforms bilaterally.
IMPRESSION: 1. Prominent hyperemic RIGHT epididymis is most consistent with mild
RIGHT epididymitis.
2. Normal testicles with normal vascular flow.
3. No evidence of orchitis or torsion.

## 2020-02-29 ENCOUNTER — Ambulatory Visit: Payer: 59

## 2020-04-05 ENCOUNTER — Other Ambulatory Visit: Payer: Self-pay

## 2020-04-05 ENCOUNTER — Encounter: Payer: Self-pay | Admitting: Physician Assistant

## 2020-04-05 ENCOUNTER — Ambulatory Visit: Payer: No Typology Code available for payment source | Admitting: Physician Assistant

## 2020-04-05 VITALS — BP 110/68 | HR 60 | Ht 75.0 in | Wt 185.2 lb

## 2020-04-05 DIAGNOSIS — R002 Palpitations: Secondary | ICD-10-CM

## 2020-04-05 DIAGNOSIS — I340 Nonrheumatic mitral (valve) insufficiency: Secondary | ICD-10-CM | POA: Diagnosis not present

## 2020-04-05 DIAGNOSIS — U071 COVID-19: Secondary | ICD-10-CM

## 2020-04-05 DIAGNOSIS — R0683 Snoring: Secondary | ICD-10-CM | POA: Diagnosis not present

## 2020-04-05 NOTE — Progress Notes (Signed)
Cardiology Office Note:    Date:  04/07/2020   ID:  FERDINAND REVOIR, DOB 1961-11-13, MRN [id number]  PCP:  Tally Joe, MD  Cardiologist:  Nicki Guadalajara, MD  Electrophysiologist:  None   Referring MD: Tally Joe, MD   Chief Complaint  Patient presents with  . Follow-up    seen for Dr. Tresa Endo.     History of Present Illness:    David Moreno is a 59 y.o. male with a hx of hypothyroidism, mild OSA not on CPAP, NSVT and PVCs/PACs.  Previous echocardiogram in September 2013 showed EF 55%, flattened mitral valve closure without definitive prolapse, mild MR, moderate TR without stenosis.  Heart monitor around the time showed 2 episodes of short-lived nonsustained VT up to 10 beats which occurred during a period of increased stress as he was rushing to go to the airport. Home sleep study obtained in November 2016 demonstrated mild obstructive sleep apnea with overall AHI 8.4, lowest oxygen saturation was 89%, percentage of evening snoring was only 1.4%.  He used to be on beta-blocker such as Bystolic, however has since came off of it due to fatigue.  Patient presents today for cardiology office visit.  Unfortunately he had COVID-19 infection on 02/25/2020.  Symptom lasted for roughly 5 days.  He had a lot of malaise and body ache.  However he did not have any significant shortness of breath at the time.  He kept himself isolated from the rest of the family for roughly 9 days.  No one else in the household had COVID-19.  His overall condition has returned back to normal at this time.  He denies any chest pain during or after the COVID-19 infection.  He is able to play sports recently without any shortness of breath or fatigue.  He is essentially back to his normal self.  On physical exam, he has no lower extremity edema, orthopnea or PND.  He may follow-up with Dr. Tresa Endo in 1 year.  Of note, recently, family member has been noticing increasing amount of snoring and he has been waking up  multiple times at night.  He feels like he is not getting restorative sleep at night and is concerned about his obstructive sleep apnea is getting worse.  I recommended a repeat home sleep study to further evaluate.  Past Medical History:  Diagnosis Date  . Acute appendicitis 2009  . Anxiety   . Hypothyroidism   . NSVT (nonsustained ventricular tachycardia) [facility])    Nuclear stress test September 2013 was negative for ischemia. Exercise capacity 16 METS, low risk scan.  2-D echocardiogram September 2013 ejection fraction greater than 55%. No valvular disease noted..    Past Surgical History:  Procedure Laterality Date  . LAPAROSCOPIC APPENDECTOMY  2009    Current Medications: Current Meds  Medication Sig  . aspirin 81 MG tablet Take 81 mg by mouth daily.  Marland Kitchen escitalopram (LEXAPRO) 10 MG tablet Take 10 mg by mouth daily.  Marland Kitchen levothyroxine (SYNTHROID) 175 MCG tablet Take 175 mcg by mouth daily.  Marland Kitchen zolpidem (AMBIEN) 10 MG tablet Take 10 mg by mouth at bedtime as needed for sleep. Takes 1/2 tablet prn  . [DISCONTINUED] levothyroxine (SYNTHROID, LEVOTHROID) 137 MCG tablet Take 137 mcg by mouth daily before breakfast.     Allergies:   Patient has no known allergies.   Social History   Socioeconomic History  . Marital status: Married    Spouse name: Not on file  . Number of children: Not on  file  . Years of education: Not on file  . Highest education level: Not on file  Occupational History  . Not on file  Tobacco Use  . Smoking status: Never Smoker  . Smokeless tobacco: Never Used  Substance and Sexual Activity  . Alcohol use: Yes    Alcohol/week: 0.0 standard drinks    Comment: beer 2 times per week.  . Drug use: No  . Sexual activity: Not on file  Other Topics Concern  . Not on file  Social History Narrative  . Not on file   Social Determinants of Health   Financial Resource Strain:   . Difficulty of Paying Living Expenses:   Food Insecurity:   . Worried About  Programme researcher, broadcasting/film/video in the Last Year:   . Barista in the Last Year:   Transportation Needs:   . Freight forwarder (Medical):   Marland Kitchen Lack of Transportation (Non-Medical):   Physical Activity:   . Days of Exercise per Week:   . Minutes of Exercise per Session:   Stress:   . Feeling of Stress :   Social Connections:   . Frequency of Communication with Friends and Family:   . Frequency of Social Gatherings with Friends and Family:   . Attends Religious Services:   . Active Member of Clubs or Organizations:   . Attends Banker Meetings:   Marland Kitchen Marital Status:      Family History: The patient's family history includes Heart attack in his mother; Heart disease in his maternal grandmother.  ROS:   Please see the history of present illness.     All other systems reviewed and are negative.  EKGs/Labs/Other Studies Reviewed:    The following studies were reviewed today:  Echo 08/25/2012   EKG:  EKG is ordered today.  The ekg ordered today demonstrates normal sinus rhythm, single PAC, otherwise no significant ST-T wave changes.  Recent Labs: No results found for requested labs within last 8760 hours.  Recent Lipid Panel    Component Value Date/Time   CHOL 195 08/15/2015 0919   TRIG 106 08/15/2015 0919   HDL 58 08/15/2015 0919   CHOLHDL 3.4 08/15/2015 0919   VLDL 21 08/15/2015 0919   LDLCALC 116 08/15/2015 0919    Physical Exam:    VS:  BP 110/68   Pulse 60   Ht 6\' 3"  (1.905 m)   Wt 185 lb 3.2 oz (84 kg)   BMI 23.15 kg/m     Wt Readings from Last 3 Encounters:  04/05/20 185 lb 3.2 oz (84 kg)  10/18/18 193 lb 12.8 oz (87.9 kg)  04/19/18 197 lb 12.8 oz (89.7 kg)     GEN:  Well nourished, well developed in no acute distress HEENT: Normal NECK: No JVD; No carotid bruits LYMPHATICS: No lymphadenopathy CARDIAC: RRR, no murmurs, rubs, gallops RESPIRATORY:  Clear to auscultation without rales, wheezing or rhonchi  ABDOMEN: Soft, non-tender,  non-distended MUSCULOSKELETAL:  No edema; No deformity  SKIN: Warm and dry NEUROLOGIC:  Alert and oriented x 3 PSYCHIATRIC:  Normal affect   ASSESSMENT:    1. Heart palpitations   2. Snoring   3. Nonrheumatic mitral valve regurgitation   4. COVID-19    PLAN:    In order of problems listed above:  1. Palpitation: Previous heart monitor showed PVCs, PVCs and a short episode of nonsustained VT.  He denies any recent symptomatic palpitation or dizziness  2. Snoring: He had mild obstructive sleep  apnea noted on previous sleep study, however recently, family has noticed increased snoring.  I recommend a repeat sleep study  3. Mitral valve regurgitation: No significant heart murmur on exam, previous echocardiogram in 2013 showed mild MR  4. COVID-19 infection: Recently he recovered from COVID-19 infection.   Medication Adjustments/Labs and Tests Ordered: Current medicines are reviewed at length with the patient today.  Concerns regarding medicines are outlined above.  Orders Placed This Encounter  Procedures  . EKG 12-Lead  . Home sleep test   No orders of the defined types were placed in this encounter.   Patient Instructions  Medication Instructions:  Continue current medications  *If you need a refill on your cardiac medications before your next appointment, please call your pharmacy*   Lab Work: None Ordered   Testing/Procedures: Your physician has recommended that you have a home sleep study. This test records several body functions during sleep, including: brain activity, eye movement, oxygen and carbon dioxide blood levels, heart rate and rhythm, breathing rate and rhythm, the flow of air through your mouth and nose, snoring, body muscle movements, and chest and belly movement.  Follow-Up: At [facility], you and your health needs are our priority.  As part of our continuing mission to provide you with exceptional heart care, we have created designated Provider  Care Teams.  These Care Teams include your primary Cardiologist (physician) and Advanced Practice Providers (APPs -  Physician Assistants and Nurse Practitioners) who all work together to provide you with the care you need, when you need it.  We recommend signing up for the patient portal called "MyChart".  Sign up information is provided on this After Visit Summary.  MyChart is used to connect with patients for Virtual Visits (Telemedicine).  Patients are able to view lab/test results, encounter notes, upcoming appointments, etc.  Non-urgent messages can be sent to your provider as well.   To learn more about what you can do with MyChart, go to [email or web address].    Your next appointment:   1 year(s)  The format for your next appointment:   In Person  Provider:   You may see Shelva Majestic, MD or one of the following Advanced Practice Providers on your designated Care Team:    Almyra Deforest, PA-C  Fabian Sharp, PA-C or   Roby Lofts, PA-C        Signed, [place], [place]  04/07/2020 9:38 PM    David Moreno

## 2020-04-05 NOTE — Patient Instructions (Signed)
Medication Instructions:  Continue current medications  *If you need a refill on your cardiac medications before your next appointment, please call your pharmacy*   Lab Work: None Ordered   Testing/Procedures: Your physician has recommended that you have a home sleep study. This test records several body functions during sleep, including: brain activity, eye movement, oxygen and carbon dioxide blood levels, heart rate and rhythm, breathing rate and rhythm, the flow of air through your mouth and nose, snoring, body muscle movements, and chest and belly movement.  Follow-Up: At [facility], you and your health needs are our priority.  As part of our continuing mission to provide you with exceptional heart care, we have created designated Provider Care Teams.  These Care Teams include your primary Cardiologist (physician) and Advanced Practice Providers (APPs -  Physician Assistants and Nurse Practitioners) who all work together to provide you with the care you need, when you need it.  We recommend signing up for the patient portal called "MyChart".  Sign up information is provided on this After Visit Summary.  MyChart is used to connect with patients for Virtual Visits (Telemedicine).  Patients are able to view lab/test results, encounter notes, upcoming appointments, etc.  Non-urgent messages can be sent to your provider as well.   To learn more about what you can do with MyChart, go to [email or web address].    Your next appointment:   1 year(s)  The format for your next appointment:   In Person  Provider:   You may see Nicki Guadalajara, MD or one of the following Advanced Practice Providers on your designated Care Team:    Azalee Course, PA-C  Micah Flesher, PA-C or   Judy Pimple, [place]

## 2020-04-07 ENCOUNTER — Encounter: Payer: Self-pay | Admitting: Physician Assistant

## 2020-05-11 ENCOUNTER — Telehealth: Payer: Self-pay | Admitting: *Deleted

## 2020-05-11 NOTE — Telephone Encounter (Signed)
HST scheduled and patient notified.

## 2020-05-30 ENCOUNTER — Telehealth: Payer: Self-pay | Admitting: Cardiovascular Disease

## 2020-05-30 NOTE — Telephone Encounter (Signed)
Patient calling to find out how much his sleep study will be.

## 2020-06-01 ENCOUNTER — Ambulatory Visit: Payer: Self-pay | Attending: Internal Medicine

## 2020-06-01 DIAGNOSIS — Z23 Encounter for immunization: Secondary | ICD-10-CM

## 2020-06-01 NOTE — Progress Notes (Signed)
   Covid-19 Vaccination Clinic  Name:  David Moreno    MRN: [id number] DOB: 12-Jun-1961  06/01/2020  [name] was observed post Covid-19 immunization for 15 minutes without incident. He was provided with Vaccine Information Sheet and instruction to access the V-Safe system.   [name] was instructed to call 911 with any severe reactions post vaccine: Marland Kitchen Difficulty breathing  . Swelling of face and throat  . A fast heartbeat  . A bad rash all over body  . Dizziness and weakness   Immunizations Administered    Name Date Dose VIS Date Route   Pfizer COVID-19 Vaccine 06/01/2020  1:21 PM 0.3 mL 02/01/2019 Intramuscular   Manufacturer: [employer], [employer]   Lot: [id number]   NDC: [address]-9

## 2020-06-22 ENCOUNTER — Ambulatory Visit: Payer: No Typology Code available for payment source

## 2020-07-19 ENCOUNTER — Encounter (HOSPITAL_BASED_OUTPATIENT_CLINIC_OR_DEPARTMENT_OTHER): Payer: No Typology Code available for payment source | Admitting: Cardiovascular Disease

## 2020-07-24 ENCOUNTER — Ambulatory Visit: Payer: No Typology Code available for payment source

## 2020-11-19 ENCOUNTER — Telehealth: Payer: Self-pay | Admitting: Cardiovascular Disease

## 2020-11-19 NOTE — Telephone Encounter (Signed)
Called patient no answer.LMTC. 

## 2020-11-19 NOTE — Telephone Encounter (Signed)
Pt c/o Shortness Of Breath: STAT if SOB developed within the last 24 hours or pt is noticeably SOB on the phone  1. Are you currently SOB (can you hear that pt is SOB on the phone)? no  2. How long have you been experiencing SOB? 7-10 days   3. Are you SOB when sitting or when up moving around? With exercise. Pt is not SOB when he is not exercising  4. Are you currently experiencing any other symptoms? Constant indigestion   Pt knows his body and just hasn't felt right for the past week-10 days. Patient would just like to be checked out by Dr. Tresa Endo

## 2020-11-27 NOTE — Telephone Encounter (Signed)
Patient returning call.

## 2020-11-27 NOTE — Telephone Encounter (Signed)
2nd  attempt to reach patient. Left message

## 2021-04-11 ENCOUNTER — Other Ambulatory Visit: Payer: Self-pay

## 2021-04-11 ENCOUNTER — Encounter: Payer: Self-pay | Admitting: Physician Assistant

## 2021-04-11 ENCOUNTER — Ambulatory Visit (INDEPENDENT_AMBULATORY_CARE_PROVIDER_SITE_OTHER): Payer: No Typology Code available for payment source | Admitting: Physician Assistant

## 2021-04-11 VITALS — BP 112/68 | HR 57 | Ht 76.0 in | Wt 191.6 lb

## 2021-04-11 DIAGNOSIS — G4733 Obstructive sleep apnea (adult) (pediatric): Secondary | ICD-10-CM

## 2021-04-11 DIAGNOSIS — K30 Functional dyspepsia: Secondary | ICD-10-CM | POA: Diagnosis not present

## 2021-04-11 DIAGNOSIS — E039 Hypothyroidism, unspecified: Secondary | ICD-10-CM | POA: Diagnosis not present

## 2021-04-11 MED ORDER — PANTOPRAZOLE SODIUM 40 MG PO TBEC: 40.0000 mg | DELAYED_RELEASE_TABLET | Freq: Every day | ORAL | 1 refills | Status: DC

## 2021-04-11 NOTE — Progress Notes (Signed)
Cardiology Office Note:    Date:  04/12/2021   ID:  David Moreno, DOB 1961/07/13, MRN [id number]  PCP:  Tally Joe, MD   [facility] Providers Cardiologist:  Nicki Guadalajara, MD {  Referring MD: Tally Joe, MD   Chief Complaint  Patient presents with  . Follow-up    Seen for Dr. Tresa Endo    History of Present Illness:    David Moreno is a 60 y.o. male with a hx of hypothyroidism, OSA not on CPAP, NSVT and PVCs/PACs.  Previous echocardiogram in September 2013 showed EF 55%, flattened mitral valve closure without definitive prolapse, mild MR, mild TR without stenosis.  Heart monitor around the time showed 2 episodes of short-lived nonsustained VT up to 10 beats which occurred during a period of increased stress as he was rushing to go to the airport. Home sleep study obtained in November 2016 demonstrated mild obstructive sleep apnea with overall AHI 8.4, lowest oxygen saturation was 89%, percentage of evening snoring was only 1.4%.  He used to be on beta-blocker such as Bystolic, however has since came off of it due to fatigue.  He had COVID infection in March 2021.  Patient presents today for cardiology follow-up.  For the past several months, he has been noticing nighttime indigestion feeling.  He described as a burning sensation in the substernal area.  Symptom does not worsen with physical activity.  In fact, he has been able to play tennis for about an hour and a half in the past 2 days without any exertional symptoms.  I suspect his symptom is GI in nature, I recommended addition of Protonix 40 mg daily.  EKG showed a diffuse J-point elevation consistent with early repolarization, his symptom also argues against coronary artery disease as well.  He still has snoring and sleeping issues consistent with obstructive sleep apnea.  He had a sleep study in 2016 however never started on the CPAP therapy.  He will need a home sleep study in this case.  I recommended 89-month follow-up  for this patient to reevaluate his symptom to make sure acid reflux symptom is under better control.  If symptoms still persist, may consider referral to GI service.  I asked the patient to contact cardiology service if his symptoms become more associated with physical exertion in the future.   Past Medical History:  Diagnosis Date  . Acute appendicitis 2009  . Anxiety   . Hypothyroidism   . NSVT (nonsustained ventricular tachycardia) [facility])    Nuclear stress test September 2013 was negative for ischemia. Exercise capacity 16 METS, low risk scan.  2-D echocardiogram September 2013 ejection fraction greater than 55%. No valvular disease noted..    Past Surgical History:  Procedure Laterality Date  . LAPAROSCOPIC APPENDECTOMY  2009    Current Medications: Current Meds  Medication Sig  . aspirin 81 MG tablet Take 81 mg by mouth daily.  Marland Kitchen escitalopram (LEXAPRO) 10 MG tablet Take 10 mg by mouth daily.  Marland Kitchen levothyroxine (SYNTHROID) 175 MCG tablet Take 175 mcg by mouth daily.  . pantoprazole (PROTONIX) 40 MG tablet Take 1 tablet (40 mg total) by mouth daily.  Marland Kitchen zolpidem (AMBIEN) 10 MG tablet Take 10 mg by mouth at bedtime as needed for sleep. Takes 1/2 tablet prn     Allergies:   Patient has no known allergies.   Social History   Socioeconomic History  . Marital status: Married    Spouse name: Not on file  . Number of  children: Not on file  . Years of education: Not on file  . Highest education level: Not on file  Occupational History  . Not on file  Tobacco Use  . Smoking status: Never Smoker  . Smokeless tobacco: Never Used  Substance and Sexual Activity  . Alcohol use: Yes    Alcohol/week: 0.0 standard drinks    Comment: beer 2 times per week.  . Drug use: No  . Sexual activity: Not on file  Other Topics Concern  . Not on file  Social History Narrative  . Not on file   Social Determinants of Health   Financial Resource Strain: Not on file  Food Insecurity: Not on  file  Transportation Needs: Not on file  Physical Activity: Not on file  Stress: Not on file  Social Connections: Not on file     Family History: The patient's family history includes Heart attack in his mother; Heart disease in his maternal grandmother.  ROS:   Please see the history of present illness.    All other systems reviewed and are negative.  EKGs/Labs/Other Studies Reviewed:    The following studies were reviewed today:  N/A  EKG:  EKG is ordered today.  The ekg ordered today demonstrates normal sinus rhythm with PACs, diffuse J-point elevation without reciprocal changes consistent with early repolarization.  Recent Labs: No results found for requested labs within last 8760 hours.  Recent Lipid Panel    Component Value Date/Time   CHOL 195 08/15/2015 0919   TRIG 106 08/15/2015 0919   HDL 58 08/15/2015 0919   CHOLHDL 3.4 08/15/2015 0919   VLDL 21 08/15/2015 0919   LDLCALC 116 08/15/2015 0919     Risk Assessment/Calculations:       Physical Exam:    VS:  BP 112/68   Pulse (!) 57   Ht 6\' 4"  (1.93 m)   Wt 191 lb 9.6 oz (86.9 kg)   BMI 23.32 kg/m     Wt Readings from Last 3 Encounters:  04/11/21 191 lb 9.6 oz (86.9 kg)  04/05/20 185 lb 3.2 oz (84 kg)  10/18/18 193 lb 12.8 oz (87.9 kg)     GEN:  Well nourished, well developed in no acute distress HEENT: Normal NECK: No JVD; No carotid bruits LYMPHATICS: No lymphadenopathy CARDIAC: RRR, no murmurs, rubs, gallops RESPIRATORY:  Clear to auscultation without rales, wheezing or rhonchi  ABDOMEN: Soft, non-tender, non-distended MUSCULOSKELETAL:  No edema; No deformity  SKIN: Warm and dry NEUROLOGIC:  Alert and oriented x 3 PSYCHIATRIC:  Normal affect   ASSESSMENT:    1. Indigestion   2. Mild OSA (obstructive sleep apnea)   3. Hypothyroidism, unspecified type    PLAN:    In order of problems listed above:  1. Indigestion: His chest discomfort does not occur with exertion and typically occurs  when he is laying down.  He described as a burning sensation.  Symptom is more consistent with GERD.  I recommended a trial of Protonix 40 mg daily  2. Obstructive sleep apnea: He had abnormal sleep study in 2016 however never started on CPAP therapy.  He continued to have trouble sleeping with daytime somnolence, I think he would benefit from a home sleep study  3. Hypothyroidism: On levothyroxine.    Medication Adjustments/Labs and Tests Ordered: Current medicines are reviewed at length with the patient today.  Concerns regarding medicines are outlined above.  Orders Placed This Encounter  Procedures  . EKG 12-Lead  . Split night study  Meds ordered this encounter  Medications  . pantoprazole (PROTONIX) 40 MG tablet    Sig: Take 1 tablet (40 mg total) by mouth daily.    Dispense:  90 tablet    Refill:  1    Patient Instructions  Medication Instructions:   START Protonix 40 mg daily  *If you need a refill on your cardiac medications before your next appointment, please call your pharmacy*  Lab Work: NONE ordered at this time of appointment   If you have labs (blood work) drawn today and your tests are completely normal, you will receive your results only by: Marland Kitchen MyChart Message (if you have MyChart) OR . A paper copy in the mail If you have any lab test that is abnormal or we need to change your treatment, we will call you to review the results.  Testing/Procedures: Sleep testing at [facility] hospital arrive 8pm on June 27, 2021  Follow-Up: At [facility], you and your health needs are our priority.  As part of our continuing mission to provide you with exceptional heart care, we have created designated Provider Care Teams.  These Care Teams include your primary Cardiologist (physician) and Advanced Practice Providers (APPs -  Physician Assistants and Nurse Practitioners) who all work together to provide you with the care you need, when you need it.  Your next  appointment:   2-3 month(s)  The format for your next appointment:   In Person  Provider:   Nicki Guadalajara, MD or Azalee Course, PA-C     Signed, [place], [place]  04/12/2021 11:35 PM    [facility]

## 2021-04-11 NOTE — Patient Instructions (Addendum)
Medication Instructions:   START Protonix 40 mg daily  *If you need a refill on your cardiac medications before your next appointment, please call your pharmacy*  Lab Work: NONE ordered at this time of appointment   If you have labs (blood work) drawn today and your tests are completely normal, you will receive your results only by: Marland Kitchen MyChart Message (if you have MyChart) OR . A paper copy in the mail If you have any lab test that is abnormal or we need to change your treatment, we will call you to review the results.  Testing/Procedures: Sleep testing at [facility] arrive 8pm on June 27, 2021  Follow-Up: At [facility], you and your health needs are our priority.  As part of our continuing mission to provide you with exceptional heart care, we have created designated Provider Care Teams.  These Care Teams include your primary Cardiologist (physician) and Advanced Practice Providers (APPs -  Physician Assistants and Nurse Practitioners) who all work together to provide you with the care you need, when you need it.  Your next appointment:   2-3 month(s)  The format for your next appointment:   In Person  Provider:   Nicki Guadalajara, MD or Azalee Course, PA-C

## 2021-04-12 ENCOUNTER — Encounter: Payer: Self-pay | Admitting: Physician Assistant

## 2021-06-12 ENCOUNTER — Ambulatory Visit (INDEPENDENT_AMBULATORY_CARE_PROVIDER_SITE_OTHER): Payer: No Typology Code available for payment source | Admitting: Physician Assistant

## 2021-06-12 ENCOUNTER — Other Ambulatory Visit: Payer: Self-pay

## 2021-06-12 ENCOUNTER — Encounter: Payer: Self-pay | Admitting: Physician Assistant

## 2021-06-12 VITALS — BP 110/70 | HR 63 | Ht 76.0 in | Wt 191.6 lb

## 2021-06-12 DIAGNOSIS — K30 Functional dyspepsia: Secondary | ICD-10-CM

## 2021-06-12 DIAGNOSIS — G4733 Obstructive sleep apnea (adult) (pediatric): Secondary | ICD-10-CM | POA: Diagnosis not present

## 2021-06-12 DIAGNOSIS — E039 Hypothyroidism, unspecified: Secondary | ICD-10-CM | POA: Diagnosis not present

## 2021-06-12 NOTE — Progress Notes (Signed)
Cardiology Office Note:    Date:  06/14/2021   ID:  David Moreno, DOB 07/12/1961, MRN [id number]  PCP:  Tally Joe, MD   [facility] Providers Cardiologist:  Nicki Guadalajara, MD     Referring MD: Tally Joe, MD   Chief Complaint  Patient presents with   Follow-up    Seen for Dr. Tresa Endo    History of Present Illness:    David Moreno is a 60 y.o. male with a hx of hypothyroidism, OSA not on CPAP, NSVT and PVCs/PACs.  Previous echocardiogram in September 2013 showed EF 55%, flattened mitral valve closure without definitive prolapse, mild MR, mild TR without stenosis.  Heart monitor around the time showed 2 episodes of short-lived nonsustained VT up to 10 beats which occurred during a period of increased stress as he was rushing to go to the airport. Home sleep study obtained in November 2016 demonstrated mild obstructive sleep apnea with overall AHI 8.4, lowest oxygen saturation was 89%, percentage of evening snoring was only 1.4%.  He used to be on beta-blocker such as Bystolic, however has since came off of it due to fatigue.  He had COVID infection in March 2021.  I last saw the patient on 04/11/2021, at which time he was complaining of nighttime indigestion feeling for several months.  He described burning sensation in the substernal area.  Patient does not worsen with physical activity.  I suspect that his symptom was GI in nature and recommended addition of Protonix 40 mg daily.  EKG at the time showed diffuse J-point elevation consistent with early repol.  His sleep study was consistent with obstructive sleep apnea.  Patient presents today for follow-up.  He has upcoming sleep study scheduled.  Since starting on the Protonix, his chest burning sensation at night has significantly improved.  He is able to play tennis outside in the heat without any exertional symptoms.  He does not require any further cardiac work-up.  He has no lower extremity edema, orthopnea or PND.  He  can follow-up in 1 year with Dr. Tresa Endo, earlier if sleep study come back abnormal.   Past Medical History:  Diagnosis Date   Acute appendicitis 2009   Anxiety    Hypothyroidism    NSVT (nonsustained ventricular tachycardia) [facility])    Nuclear stress test September 2013 was negative for ischemia. Exercise capacity 16 METS, low risk scan.  2-D echocardiogram September 2013 ejection fraction greater than 55%. No valvular disease noted..    Past Surgical History:  Procedure Laterality Date   LAPAROSCOPIC APPENDECTOMY  2009    Current Medications: Current Meds  Medication Sig   escitalopram (LEXAPRO) 10 MG tablet Take 10 mg by mouth daily.   levothyroxine (SYNTHROID) 175 MCG tablet Take 175 mcg by mouth daily.   pantoprazole (PROTONIX) 40 MG tablet Take 1 tablet (40 mg total) by mouth daily.   zolpidem (AMBIEN) 10 MG tablet Take 10 mg by mouth at bedtime as needed for sleep. Takes 1/2 tablet prn   [DISCONTINUED] aspirin 81 MG tablet Take 81 mg by mouth daily.     Allergies:   Patient has no known allergies.   Social History   Socioeconomic History   Marital status: Married    Spouse name: Not on file   Number of children: Not on file   Years of education: Not on file   Highest education level: Not on file  Occupational History   Not on file  Tobacco Use   Smoking status:  Never   Smokeless tobacco: Never  Substance and Sexual Activity   Alcohol use: Yes    Alcohol/week: 0.0 standard drinks    Comment: beer 2 times per week.   Drug use: No   Sexual activity: Not on file  Other Topics Concern   Not on file  Social History Narrative   Not on file   Social Determinants of Health   Financial Resource Strain: Not on file  Food Insecurity: Not on file  Transportation Needs: Not on file  Physical Activity: Not on file  Stress: Not on file  Social Connections: Not on file     Family History: The patient's family history includes Heart attack in his mother; Heart disease  in his maternal grandmother.  ROS:   Please see the history of present illness.     All other systems reviewed and are negative.  EKGs/Labs/Other Studies Reviewed:    The following studies were reviewed today:  N/A  EKG:  EKG is not ordered today.    Recent Labs: No results found for requested labs within last 8760 hours.  Recent Lipid Panel    Component Value Date/Time   CHOL 195 08/15/2015 0919   TRIG 106 08/15/2015 0919   HDL 58 08/15/2015 0919   CHOLHDL 3.4 08/15/2015 0919   VLDL 21 08/15/2015 0919   LDLCALC 116 08/15/2015 0919     Risk Assessment/Calculations:           Physical Exam:    VS:  BP 110/70 (BP Location: Left Arm)   Pulse 63   Ht 6\' 4"  (1.93 m)   Wt 191 lb 9.6 oz (86.9 kg)   SpO2 98%   BMI 23.32 kg/m     Wt Readings from Last 3 Encounters:  06/12/21 191 lb 9.6 oz (86.9 kg)  04/11/21 191 lb 9.6 oz (86.9 kg)  04/05/20 185 lb 3.2 oz (84 kg)     GEN:  Well nourished, well developed in no acute distress HEENT: Normal NECK: No JVD; No carotid bruits LYMPHATICS: No lymphadenopathy CARDIAC: RRR, no murmurs, rubs, gallops RESPIRATORY:  Clear to auscultation without rales, wheezing or rhonchi  ABDOMEN: Soft, non-tender, non-distended MUSCULOSKELETAL:  No edema; No deformity  SKIN: Warm and dry NEUROLOGIC:  Alert and oriented x 3 PSYCHIATRIC:  Normal affect   ASSESSMENT:    1. Indigestion   2. Hypothyroidism, unspecified type   3. OSA (obstructive sleep apnea)    PLAN:    In order of problems listed above:  Indigestion: Symptoms significantly improved after addition of Protonix.  No further work-up is needed.  Hypothyroidism: On levothyroxine  Obstructive sleep apnea: Upcoming sleep study.  If abnormal, will need to follow-up with Dr. 04/07/20 sooner.        Medication Adjustments/Labs and Tests Ordered: Current medicines are reviewed at length with the patient today.  Concerns regarding medicines are outlined above.  No orders of  the defined types were placed in this encounter.  No orders of the defined types were placed in this encounter.   Patient Instructions  Medication Instructions:  Your physician recommends that you continue on your current medications as directed. Please refer to the Current Medication list given to you today.  *If you need a refill on your cardiac medications before your next appointment, please call your pharmacy*  Lab Work: NONE ordered at this time of appointment   If you have labs (blood work) drawn today and your tests are completely normal, you will receive your results only  by: MyChart Message (if you have MyChart) OR A paper copy in the mail If you have any lab test that is abnormal or we need to change your treatment, we will call you to review the results.  Testing/Procedures: NONE ordered at this time of appointment   Follow-Up: At [facility], you and your health needs are our priority.  As part of our continuing mission to provide you with exceptional heart care, we have created designated Provider Care Teams.  These Care Teams include your primary Cardiologist (physician) and Advanced Practice Providers (APPs -  Physician Assistants and Nurse Practitioners) who all work together to provide you with the care you need, when you need it.   Your next appointment:   1 year(s)  The format for your next appointment:   In Person  Provider:   Nicki Guadalajara, MD  Other Instructions    Signed, Azalee Course, PA  06/14/2021 11:06 PM    [facility]

## 2021-06-12 NOTE — Patient Instructions (Signed)
Medication Instructions:  Your physician recommends that you continue on your current medications as directed. Please refer to the Current Medication list given to you today.  *If you need a refill on your cardiac medications before your next appointment, please call your pharmacy*  Lab Work: NONE ordered at this time of appointment   If you have labs (blood work) drawn today and your tests are completely normal, you will receive your results only by: . MyChart Message (if you have MyChart) OR . A paper copy in the mail If you have any lab test that is abnormal or we need to change your treatment, we will call you to review the results.  Testing/Procedures: NONE ordered at this time of appointment   Follow-Up: At [facility], you and your health needs are our priority.  As part of our continuing mission to provide you with exceptional heart care, we have created designated Provider Care Teams.  These Care Teams include your primary Cardiologist (physician) and Advanced Practice Providers (APPs -  Physician Assistants and Nurse Practitioners) who all work together to provide you with the care you need, when you need it.  Your next appointment:   1 year(s)  The format for your next appointment:   In Person  Provider:   Thomas Kelly, MD  Other Instructions   

## 2021-06-14 ENCOUNTER — Encounter: Payer: Self-pay | Admitting: Physician Assistant

## 2021-06-20 ENCOUNTER — Encounter (HOSPITAL_BASED_OUTPATIENT_CLINIC_OR_DEPARTMENT_OTHER): Payer: No Typology Code available for payment source | Admitting: Cardiovascular Disease

## 2021-06-25 ENCOUNTER — Encounter (HOSPITAL_BASED_OUTPATIENT_CLINIC_OR_DEPARTMENT_OTHER): Payer: Self-pay

## 2021-06-27 ENCOUNTER — Encounter (HOSPITAL_BASED_OUTPATIENT_CLINIC_OR_DEPARTMENT_OTHER): Payer: No Typology Code available for payment source | Admitting: Cardiovascular Disease

## 2021-07-09 ENCOUNTER — Other Ambulatory Visit: Payer: Self-pay | Admitting: Cardiovascular Disease

## 2021-07-09 DIAGNOSIS — G4733 Obstructive sleep apnea (adult) (pediatric): Secondary | ICD-10-CM

## 2021-07-09 DIAGNOSIS — I472 Ventricular tachycardia: Secondary | ICD-10-CM

## 2021-07-09 DIAGNOSIS — R5383 Other fatigue: Secondary | ICD-10-CM

## 2021-07-09 DIAGNOSIS — I4729 Other ventricular tachycardia: Secondary | ICD-10-CM

## 2021-07-19 ENCOUNTER — Encounter (HOSPITAL_BASED_OUTPATIENT_CLINIC_OR_DEPARTMENT_OTHER): Payer: No Typology Code available for payment source | Admitting: Cardiovascular Disease

## 2021-07-23 ENCOUNTER — Other Ambulatory Visit: Payer: Self-pay | Admitting: Physician Assistant

## 2021-07-24 NOTE — Telephone Encounter (Signed)
Rx(s) sent to pharmacy electronically.  

## 2021-08-14 ENCOUNTER — Ambulatory Visit (HOSPITAL_BASED_OUTPATIENT_CLINIC_OR_DEPARTMENT_OTHER)
Payer: No Typology Code available for payment source | Attending: Cardiovascular Disease | Admitting: Cardiovascular Disease

## 2021-08-14 ENCOUNTER — Other Ambulatory Visit: Payer: Self-pay

## 2021-08-14 DIAGNOSIS — G4736 Sleep related hypoventilation in conditions classified elsewhere: Secondary | ICD-10-CM | POA: Insufficient documentation

## 2021-08-14 DIAGNOSIS — I472 Ventricular tachycardia: Secondary | ICD-10-CM

## 2021-08-14 DIAGNOSIS — I4729 Other ventricular tachycardia: Secondary | ICD-10-CM

## 2021-08-14 DIAGNOSIS — G4733 Obstructive sleep apnea (adult) (pediatric): Secondary | ICD-10-CM

## 2021-08-14 DIAGNOSIS — R0902 Hypoxemia: Secondary | ICD-10-CM | POA: Diagnosis not present

## 2021-08-14 DIAGNOSIS — R5383 Other fatigue: Secondary | ICD-10-CM

## 2021-08-14 DIAGNOSIS — R002 Palpitations: Secondary | ICD-10-CM

## 2021-09-03 ENCOUNTER — Encounter (HOSPITAL_BASED_OUTPATIENT_CLINIC_OR_DEPARTMENT_OTHER): Payer: Self-pay | Admitting: Cardiovascular Disease

## 2021-09-03 NOTE — Procedures (Signed)
      Patient Name: David Moreno, David Moreno Date: 08/14/2021 Gender: Male D.O.B: January 14, 1961 Age (years): 89 Referring Provider: Azalee Course PA Height (inches): 76 Interpreting Physician: Nicki Guadalajara MD, ABSM Weight (lbs): 190 RPSGT: Spicer Sink BMI: 23 MRN: [id number] Neck Size: <br>  CLINICAL INFORMATION Sleep Study Type: HST  Indication for sleep study: snoring, nocturnal palpitations, OSA untreated.  Epworth Sleepiness Score: 4  SLEEP STUDY TECHNIQUE A multi-channel overnight portable sleep study was performed. The channels recorded were: nasal airflow, thoracic respiratory movement, and oxygen saturation with a pulse oximetry. Snoring was also monitored.  MEDICATIONS Patient self administered medications include: N/A.  SLEEP ARCHITECTURE Patient was studied for 371.7 minutes. The sleep efficiency was 100.0 % and the patient was supine for 51.2%. The arousal index was 0.0 per hour.  RESPIRATORY PARAMETERS The overall AHI was 29.4 per hour, with a central apnea index of 0.2 per hour. There is a significant positional component with supine sleep AHI 49.2/h versus non-supine sleep AHI 8.6/h.  The oxygen nadir was 78% during sleep.  CARDIAC DATA Mean heart rate during sleep was 55.3 bpm.  IMPRESSIONS - Moderate obstructive sleep apnea overall (AHI 29.4/h); however, events were severe with supine sleep (AHI 49.2/h). The severity during REM sleep cannot be assessed on this home study. - No significant central sleep apnea occurred during this study (CAI = 0.2/h). - Severe oxygen desaturation to a nadir of 78%. - Patient snored 1.8% during the sleep.  DIAGNOSIS - Obstructive Sleep Apnea (G47.33) - Nocturnal Hypoxemia (G47.36)  RECOMMENDATIONS - Recommend CPAP therapy with an initial trial of Auto-PAP with EPR of 3 at 6 - 18 cm of water with heated humidification. - Effort should be mnade to optimize nasal and oropharyngeal patency. - Positional therapy  avoiding supine position during sleep. - Avoid alcohol, sedatives and other CNS depressants that may worsen sleep apnea and disrupt normal sleep architecture. - Sleep hygiene should be reviewed to assess factors that may improve sleep quality. - Weight management and regular exercise should be initiated or continued. - Recommend a download in 30 days and sleep clinic evaluation.   [Electronically signed] 09/03/2021 02:40 PM  Nicki Guadalajara MD, [facility], ABSM Diplomate, American Board of Sleep Medicine   NPI: [id number]  [facility] PH: [phone]   FX: [phone] ACCREDITED BY THE AMERICAN ACADEMY OF SLEEP MEDICINE

## 2021-09-04 ENCOUNTER — Telehealth: Payer: Self-pay | Admitting: *Deleted

## 2021-09-04 NOTE — Telephone Encounter (Signed)
-----   Message from Lennette Bihari, MD sent at 09/03/2021  2:45 PM EDT ----- Burna Mortimer, please notify pt and try Auto-PAP per note

## 2021-09-04 NOTE — Telephone Encounter (Signed)
Left message to return a call to me to discuss his HST results and recommendations.

## 2021-09-04 NOTE — Telephone Encounter (Signed)
Patient returned a call to me and was given sleep study results and recommendations. He agrees to proceed with CPAP therapy. Orders will be sent to [facility]. He was made aware of the nationwide back order, and it may be a few more months before he gets his CPAP machine.

## 2021-09-04 NOTE — Telephone Encounter (Signed)
-----   Message from Thomas A Kelly, MD sent at 09/03/2021  2:45 PM EDT ----- Donicia Druck, please notify pt and try Auto-PAP per note 

## 2021-10-01 ENCOUNTER — Telehealth: Payer: Self-pay | Admitting: Cardiovascular Disease

## 2021-10-01 NOTE — Telephone Encounter (Signed)
Patient called in, requesting to speak with David Moreno, CMA regarding his CPAP. Please return call when able.

## 2021-10-02 NOTE — Telephone Encounter (Signed)
Returned a call to the patient. He advises me that he has a old CPAP machine that was sent to him by his brother. He wonders if the company can set it for him to use until he gets his machine. He will email me a picture of it to see if this can be done. I told him once I check with the DME company I will let him know. Patient agrees with plan.

## 2021-12-01 ENCOUNTER — Emergency Department (HOSPITAL_BASED_OUTPATIENT_CLINIC_OR_DEPARTMENT_OTHER)
Admission: EM | Admit: 2021-12-01 | Discharge: 2021-12-01 | Disposition: A | Discharge status: Home / Self Care | Payer: No Typology Code available for payment source | Attending: Emergency Medicine | Admitting: Emergency Medicine

## 2021-12-01 ENCOUNTER — Other Ambulatory Visit: Payer: Self-pay

## 2021-12-01 ENCOUNTER — Encounter (HOSPITAL_BASED_OUTPATIENT_CLINIC_OR_DEPARTMENT_OTHER): Payer: Self-pay

## 2021-12-01 DIAGNOSIS — I10 Essential (primary) hypertension: Secondary | ICD-10-CM | POA: Diagnosis not present

## 2021-12-01 DIAGNOSIS — U071 COVID-19: Secondary | ICD-10-CM | POA: Diagnosis not present

## 2021-12-01 DIAGNOSIS — R0981 Nasal congestion: Secondary | ICD-10-CM | POA: Diagnosis present

## 2021-12-01 DIAGNOSIS — Z79899 Other long term (current) drug therapy: Secondary | ICD-10-CM | POA: Insufficient documentation

## 2021-12-01 DIAGNOSIS — E039 Hypothyroidism, unspecified: Secondary | ICD-10-CM | POA: Diagnosis not present

## 2021-12-01 HISTORY — DX: Essential (primary) hypertension: I10

## 2021-12-01 LAB — RESP PANEL BY RT-PCR (FLU A&B, COVID) ARPGX2
Influenza A by PCR: NEGATIVE
Influenza B by PCR: NEGATIVE
SARS Coronavirus 2 by RT PCR: POSITIVE — AB

## 2021-12-01 MED ORDER — NIRMATRELVIR/RITONAVIR (PAXLOVID)TABLET: 3.0000 | ORAL_TABLET | Freq: Two times a day (BID) | ORAL | 0 refills | Status: AC

## 2021-12-01 NOTE — Discharge Instructions (Addendum)
You are seen in the emergency department today for symptoms related to COVID-19.  We confirmed that your COVID test here today was positive.  I am prescribing you the antiviral medication.  Please follow the package instructions.  Please use Tylenol or ibuprofen for pain.  You may use 600 mg ibuprofen every 6 hours or 1000 mg of Tylenol every 6 hours.  You may choose to alternate between the 2.  This would be most effective.  Do not exceed 4 g of Tylenol within 24 hours.  Do not exceed 3200 mg ibuprofen within 24 hours.  Continue to monitor how you're doing and return to the ER for new or worsening symptoms such as difficulty breathing, fever despite medication.   It has been a pleasure seeing and caring for you today and I hope you start feeling better soon!

## 2021-12-01 NOTE — ED Triage Notes (Signed)
Pt states he tested pos for COVID with at home test and would like to start antivirals. Pt c/o headache, sore throat, congestion, fatigue.

## 2021-12-01 NOTE — ED Provider Notes (Signed)
[facility] EMERGENCY DEPT Provider Note   CSN: [id number] Arrival date & time: 12/01/21  1216     History Chief Complaint  Patient presents with   URI   Sore Throat    David Moreno is a 60 y.o. male with history of hypothyroidism, hypertension, and anxiety presents to the emergency department complaining of symptoms related to COVID-19.  Patient states that he tested positive for COVID-19 at home, with an at home test.  He presents emergency department today because he would like to start antivirals.  He states that his symptoms started approximately 4 days ago.  He is complaining of a headache, sore throat, nasal congestion, and fatigue.  No fevers, chills, chest pain, shortness of breath, abdominal pain, nausea, vomiting, or diarrhea.  He states he initially had body aches but those have gone away.   URI Presenting symptoms: congestion, fatigue and sore throat   Presenting symptoms: no cough and no fever   Associated symptoms: headaches   Associated symptoms: no myalgias   Sore Throat Associated symptoms include headaches. Pertinent negatives include no chest pain, no abdominal pain and no shortness of breath.      Past Medical History:  Diagnosis Date   Acute appendicitis 12/09/2007   Anxiety    Hypertension    Hypothyroidism    NSVT (nonsustained ventricular tachycardia)    Nuclear stress test September 2013 was negative for ischemia. Exercise capacity 16 METS, low risk scan.  2-D echocardiogram September 2013 ejection fraction greater than 55%. No valvular disease noted..    Patient Active Problem List   Diagnosis Date Noted   Mild OSA (obstructive sleep apnea) 03/28/2016   Evaluate for Sleep apnea 07/26/2015   Fatigue 06/07/2014   PAC (premature atrial contraction) 11/24/2013   NSVT (nonsustained ventricular tachycardia) 11/24/2013   Heart palpitations 05/31/2013   Hypothyroid 05/31/2013    Past Surgical History:  Procedure Laterality Date    LAPAROSCOPIC APPENDECTOMY  2009       Family History  Problem Relation Age of Onset   Heart attack Mother    Heart disease Maternal Grandmother     Social History   Tobacco Use   Smoking status: Never   Smokeless tobacco: Never  Substance Use Topics   Alcohol use: Yes    Alcohol/week: 0.0 standard drinks    Comment: beer 2 times per week.   Drug use: No    Home Medications Prior to Admission medications   Medication Sig Start Date End Date Taking? Authorizing Provider  nirmatrelvir/ritonavir EUA (PAXLOVID) 20 x 150 MG & 10 x 100MG  TABS Take 3 tablets by mouth 2 (two) times daily for 5 days. No renal impairment. Take nirmatrelvir (150 mg) two tablets twice daily for 5 days and ritonavir (100 mg) one tablet twice daily for 5 days. 12/01/21 12/06/21 Yes Kathrene Sinopoli T, PA-C  escitalopram (LEXAPRO) 10 MG tablet Take 10 mg by mouth daily.    [provider]  levothyroxine (SYNTHROID) 175 MCG tablet Take 175 mcg by mouth daily. 03/16/20   [provider]  pantoprazole (PROTONIX) 40 MG tablet TAKE 1 TABLET BY MOUTH EVERY DAY 07/24/21   07/26/21, PA  zolpidem (AMBIEN) 10 MG tablet Take 10 mg by mouth at bedtime as needed for sleep. Takes 1/2 tablet prn    [provider]    Allergies    Patient has no known allergies.  Review of Systems   Review of Systems  Constitutional:  Positive for fatigue. Negative for chills  and fever.  HENT:  Positive for congestion and sore throat. Negative for trouble swallowing.   Respiratory:  Negative for cough and shortness of breath.   Cardiovascular:  Negative for chest pain.  Gastrointestinal:  Negative for abdominal pain, constipation, diarrhea, nausea and vomiting.  Musculoskeletal:  Negative for myalgias.  Neurological:  Positive for headaches. Negative for light-headedness.  All other systems reviewed and are negative.  Physical Exam Updated Vital Signs BP (!) 176/96 (BP Location: Right Arm)    Pulse (!) 47     Temp (!) 97.5 F (36.4 C)    Resp 16    Ht 6\' 4"  (1.93 m)    Wt 83.9 kg    SpO2 100%    BMI 22.52 kg/m   Physical Exam Vitals and nursing note reviewed.  Constitutional:      Appearance: Normal appearance.  HENT:     Head: Normocephalic and atraumatic.     Nose: Congestion present.     Mouth/Throat:     Mouth: Mucous membranes are moist.     Pharynx: Oropharynx is clear.  Eyes:     Conjunctiva/sclera: Conjunctivae normal.  Cardiovascular:     Rate and Rhythm: Normal rate and regular rhythm.  Pulmonary:     Effort: Pulmonary effort is normal. No respiratory distress.     Breath sounds: Normal breath sounds.  Abdominal:     General: There is no distension.     Palpations: Abdomen is soft.     Tenderness: There is no abdominal tenderness.  Skin:    General: Skin is warm and dry.  Neurological:     General: No focal deficit present.     Mental Status: He is alert.    ED Results / Procedures / Treatments   Labs (all labs ordered are listed, but only abnormal results are displayed) Labs Reviewed  RESP PANEL BY RT-PCR (FLU A&B, COVID) ARPGX2 - Abnormal; Notable for the following components:      Result Value   SARS Coronavirus 2 by RT PCR POSITIVE (*)    All other components within normal limits    EKG None  Radiology No results found.  Procedures Procedures   Medications Ordered in ED Medications - No data to display  ED Course  I have reviewed the triage vital signs and the nursing notes.  Pertinent labs & imaging results that were available during my care of the patient were reviewed by me and considered in my medical decision making (see chart for details).    MDM Rules/Calculators/A&P                          Patient is a 60 year old male who presents the emergency department after a positive at-home COVID test, and would like to start antivirals.  Patient's had symptoms for approximately 4 days.  On my exam patient is afebrile, not tachycardic, not  hypoxic, and in no acute distress.  His lung sounds are clear to auscultation, and oropharynx is clear.  Confirmed positive COVID-19 test.  Overall patient is clinically well-appearing I do not believe he requires admission or inpatient treatment for his symptoms.  He is requesting antiviral treatment, and I think this is reasonable.  I have cross-referenced his at home medications, and I see no interactions with the recommended antiviral.  We will prescribe this, and give close return precautions.  Patient agreeable to plan.  Final Clinical Impression(s) / ED Diagnoses Final diagnoses:  COVID-19  Rx / DC Orders ED Discharge Orders          Ordered    nirmatrelvir/ritonavir EUA (PAXLOVID) 20 x 150 MG & 10 x 100MG  TABS  2 times daily        12/01/21 1442           Portions of this report may have been transcribed using voice recognition software. Every effort was made to ensure accuracy; however, inadvertent computerized transcription errors may be present.    12/03/21 12/01/21 1454    12/03/21, MD 12/01/21 2026

## 2022-03-19 ENCOUNTER — Telehealth: Payer: Self-pay | Admitting: *Deleted

## 2022-03-19 NOTE — Telephone Encounter (Signed)
Received a call from [place] me that when the patient was called to come in for set up to get his CPAP machine he told them that he has changed his diet and does not feel that he needs the machine anymore. Ordering provider will be notified. ?

## 2022-03-21 NOTE — Telephone Encounter (Signed)
ok 

## 2022-08-24 NOTE — Progress Notes (Unsigned)
Cardiology Clinic Note   Patient Name: David Moreno Date of Encounter: 08/26/2022  Primary Care Provider:  Tally Joe, MD Primary Cardiologist:  Nicki Guadalajara, MD  Patient Profile    David Moreno 61 year old male presents the clinic today for follow-up evaluation of his PACs and NSVT.  Past Medical History    Past Medical History:  Diagnosis Date   Acute appendicitis 12/09/2007   Anxiety    Hypertension    Hypothyroidism    NSVT (nonsustained ventricular tachycardia) [facility])    Nuclear stress test September 2013 was negative for ischemia. Exercise capacity 16 METS, low risk scan.  2-D echocardiogram September 2013 ejection fraction greater than 55%. No valvular disease noted..   Past Surgical History:  Procedure Laterality Date   LAPAROSCOPIC APPENDECTOMY  2009    Allergies  No Known Allergies  History of Present Illness    David Moreno is a PMH of NSVT, mild OSA, fatigue, hypothyroidism, and heart palpitations.  His echocardiogram 9/13 showed an EF of 55%, flattened mitral valve with closure without degenerative prolapse, mild MR, mild TR.  Cardiac event monitor showed 2 episodes of short NSVT up to 10 beats that occurred during a period of increased stress as he was rushing to go to the airport.  His sleep study 11/16 showed mild OSA with an overall AHI of 8.4.  His lowest oxygen saturation was 89%.  He was previously on Bystolic which was discontinued due to fatigue.  He contracted COVID-19 3/21.  He was seen in follow-up by Azalee Course PA-C on 06/12/2021.  During that time he was doing well.  He had a upcoming sleep study scheduled.  He had been started on Protonix.  He noted improvement in his sensation of chest burning.  He continued to be physically active playing tennis outside and denied exertional symptoms.  He had no lower extremity edema.  He denied orthopnea and PND.  His sleep study 08/14/2021 showed significant postural component with supine AHI of  49.2 versus nonsupine sleep 8.6/h.  He was noted to have moderate OSA with an overall AHI of 29.4.  His oxygen saturation decreased to 78.  Recommendations were made for CPAP therapy.  He presents to the clinic today for follow-up evaluation states he feels well.  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  His team recently traveled to [place] for a tournament.  He also uses his elliptical 3 times per week and plays golf.  After his last clinic visit he was seen by integrative medicine in [place].  He was placed on a gluten-free dairy free diet.  He has lost around 10 pounds.  His sleep apnea and snoring resolved.  I will have him continue his current diet, maintain his physical activity, his labs are followed by his PCP.  We will plan follow-up in 18 months.  Today the denies chest pain, shortness of breath, lower extremity edema, fatigue, palpitations, melena, hematuria, hemoptysis, diaphoresis, weakness, presyncope, syncope, orthopnea, and PND.   Home Medications    Prior to Admission medications   Medication Sig Start Date End Date Taking? Authorizing Provider  escitalopram (LEXAPRO) 10 MG tablet Take 10 mg by mouth daily.    [provider]  levothyroxine (SYNTHROID) 175 MCG tablet Take 175 mcg by mouth daily. 03/16/20   [provider]  pantoprazole (PROTONIX) 40 MG tablet TAKE 1 TABLET BY MOUTH EVERY DAY 07/24/21   Azalee Course, PA  zolpidem (AMBIEN) 10 MG tablet Take  10 mg by mouth at bedtime as needed for sleep. Takes 1/2 tablet prn    [provider]    Family History    Family History  Problem Relation Age of Onset   Heart attack Mother    Heart disease Maternal Grandmother    He indicated that his mother is deceased. He indicated that his father is alive. He indicated that both of his brothers are alive. He indicated that the status of his maternal grandmother is unknown.  Social History    Social History   Socioeconomic History    Marital status: Married    Spouse name: Not on file   Number of children: Not on file   Years of education: Not on file   Highest education level: Not on file  Occupational History   Not on file  Tobacco Use   Smoking status: Never   Smokeless tobacco: Never  Substance and Sexual Activity   Alcohol use: Yes    Alcohol/week: 0.0 standard drinks of alcohol    Comment: beer 2 times per week.   Drug use: No   Sexual activity: Not on file  Other Topics Concern   Not on file  Social History Narrative   Not on file   Social Determinants of Health   Financial Resource Strain: Not on file  Food Insecurity: Not on file  Transportation Needs: Not on file  Physical Activity: Not on file  Stress: Not on file  Social Connections: Not on file  Intimate Partner Violence: Not on file     Review of Systems    General:  No chills, fever, night sweats or weight changes.  Cardiovascular:  No chest pain, dyspnea on exertion, edema, orthopnea, palpitations, paroxysmal nocturnal dyspnea. Dermatological: No rash, lesions/masses Respiratory: No cough, dyspnea Urologic: No hematuria, dysuria Abdominal:   No nausea, vomiting, diarrhea, bright red blood per rectum, melena, or hematemesis Neurologic:  No visual changes, wkns, changes in mental status. All other systems reviewed and are otherwise negative except as noted above.  Physical Exam    VS:  BP 120/70   Pulse (!) 59   Ht 6\' 3"  (1.905 m)   Wt 182 lb (82.6 kg)   SpO2 98%   BMI 22.75 kg/m  , BMI Body mass index is 22.75 kg/m. GEN: Well nourished, well developed, in no acute distress. HEENT: normal. Neck: Supple, no JVD, carotid bruits, or masses. Cardiac: RRR, no murmurs, rubs, or gallops. No clubbing, cyanosis, edema.  Radials/DP/PT 2+ and equal bilaterally.  Respiratory:  Respirations regular and unlabored, clear to auscultation bilaterally. GI: Soft, nontender, nondistended, BS + x 4. MS: no deformity or atrophy. Skin: warm and  dry, no rash. Neuro:  Strength and sensation are intact. Psych: Normal affect.  Accessory Clinical Findings    Recent Labs: No results found for requested labs within last 365 days.   Recent Lipid Panel    Component Value Date/Time   CHOL 195 08/15/2015 0919   TRIG 106 08/15/2015 0919   HDL 58 08/15/2015 0919   CHOLHDL 3.4 08/15/2015 0919   VLDL 21 08/15/2015 0919   LDLCALC 116 08/15/2015 0919         ECG personally reviewed by me today-sinus bradycardia with marked sinus arrhythmia early repolarization 59 bpm- No acute changes  Sleep Study Type: HST   Indication for sleep study: snoring, nocturnal palpitations, OSA untreated.   Epworth Sleepiness Score: 4   SLEEP STUDY TECHNIQUE A multi-channel overnight portable sleep study was performed. The  channels recorded were: nasal airflow, thoracic respiratory movement, and oxygen saturation with a pulse oximetry. Snoring was also monitored.   MEDICATIONS Patient self administered medications include: N/A.   SLEEP ARCHITECTURE Patient was studied for 371.7 minutes. The sleep efficiency was 100.0 % and the patient was supine for 51.2%. The arousal index was 0.0 per hour.   RESPIRATORY PARAMETERS The overall AHI was 29.4 per hour, with a central apnea index of 0.2 per hour. There is a significant positional component with supine sleep AHI 49.2/h versus non-supine sleep AHI 8.6/h.   The oxygen nadir was 78% during sleep.   CARDIAC DATA Mean heart rate during sleep was 55.3 bpm.   IMPRESSIONS - Moderate obstructive sleep apnea overall (AHI 29.4/h); however, events were severe with supine sleep (AHI 49.2/h). The severity during REM sleep cannot be assessed on this home study. - No significant central sleep apnea occurred during this study (CAI = 0.2/h). - Severe oxygen desaturation to a nadir of 78%. - Patient snored 1.8% during the sleep.   DIAGNOSIS - Obstructive Sleep Apnea (G47.33) - Nocturnal Hypoxemia (G47.36)     Assessment & Plan   1.  OSA-resolved.  Waking up well rested.  There is now a gluten-free dairy free diet.  Has lost around 10 pounds.  Noted to have moderate OSA on recent sleep study 08/14/2021.   Continue current diet Maintain physical activity  GERD-continues to use Protonix.  Notes improved symptoms. GERD diet instructions given Increase physical activity as tolerated  Hypothyroidism-reports compliance with levothyroxine. Follows with PCP  Family history of coronary disease-mother had MI in her 74s grand father had MI in his 14s.  EKG today shows sinus bradycardia with marked sinus arrhythmia with early repolarization 59 bpm no ectopy. Continue current diet and physical activity  Disposition: Follow-up with Dr. Tresa Endo or me in 18 months.   Thomasene Ripple. Jeremy Mclamb NP-C     08/26/2022, 11:23 AM [facility] [address] Office [phone] Fax [phone]  Notice: This dictation was prepared with Dragon dictation along with smaller phrase technology. Any transcriptional errors that result from this process are unintentional and may not be corrected upon review.  I spent 14 minutes examining this patient, reviewing medications, and using patient centered shared decision making involving her cardiac care.  Prior to her visit I spent greater than 20 minutes reviewing her past medical history,  medications, and prior cardiac tests.

## 2022-08-26 ENCOUNTER — Ambulatory Visit: Payer: 59 | Attending: General Practice | Admitting: General Practice

## 2022-08-26 ENCOUNTER — Encounter: Payer: Self-pay | Admitting: General Practice

## 2022-08-26 VITALS — BP 120/70 | HR 59 | Ht 75.0 in | Wt 182.0 lb

## 2022-08-26 DIAGNOSIS — K30 Functional dyspepsia: Secondary | ICD-10-CM

## 2022-08-26 DIAGNOSIS — E039 Hypothyroidism, unspecified: Secondary | ICD-10-CM | POA: Diagnosis not present

## 2022-08-26 DIAGNOSIS — Z8249 Family history of ischemic heart disease and other diseases of the circulatory system: Secondary | ICD-10-CM

## 2022-08-26 DIAGNOSIS — G4733 Obstructive sleep apnea (adult) (pediatric): Secondary | ICD-10-CM

## 2022-08-26 NOTE — Patient Instructions (Signed)
Medication Instructions:  The current medical regimen is effective;  continue present plan and medications as directed. Please refer to the Current Medication list given to you today.   *If you need a refill on your cardiac medications before your next appointment, please call your pharmacy*   Lab Work: NONE If you have labs (blood work) drawn today and your tests are completely normal, you will receive your results only by: [place] (if you have MyChart) OR A paper copy in the mail If you have any lab test that is abnormal or we need to change your treatment, we will call you to review the results.   Testing/Procedures: NONE   Follow-Up: At [facility], you and your health needs are our priority.  As part of our continuing mission to provide you with exceptional heart care, we have created designated Provider Care Teams.  These Care Teams include your primary Cardiologist (physician) and Advanced Practice Providers (APPs -  Physician Assistants and Nurse Practitioners) who all work together to provide you with the care you need, when you need it.  We recommend signing up for the patient portal called "MyChart".  Sign up information is provided on this After Visit Summary.  MyChart is used to connect with patients for Virtual Visits (Telemedicine).  Patients are able to view lab/test results, encounter notes, upcoming appointments, etc.  Non-urgent messages can be sent to your provider as well.   To learn more about what you can do with MyChart, go to [email or web address].    Your next appointment:   18 month(s)  The format for your next appointment:   In Person  Provider:   Shelva Majestic, MD    Other Instructions PLEASE MAINTAIN PHYSICAL ACTIVITY-AS TOLERATED AND DIET  Important Information About Sugar

## 2022-12-23 ENCOUNTER — Other Ambulatory Visit: Payer: Self-pay | Admitting: Urology

## 2022-12-23 DIAGNOSIS — R972 Elevated prostate specific antigen [PSA]: Secondary | ICD-10-CM

## 2023-01-25 ENCOUNTER — Other Ambulatory Visit: Payer: 59

## 2023-01-26 ENCOUNTER — Ambulatory Visit
Admission: RE | Admit: 2023-01-26 | Discharge: 2023-01-26 | Disposition: A | Discharge status: Home / Self Care | Payer: 59 | Source: Ambulatory Visit | Attending: Urology | Admitting: Urology

## 2023-01-26 DIAGNOSIS — R972 Elevated prostate specific antigen [PSA]: Secondary | ICD-10-CM

## 2023-01-26 MED ORDER — GADOPICLENOL 0.5 MMOL/ML IV SOLN
8.0000 mL | Freq: Once | INTRAVENOUS | Status: AC | PRN
  Administered 2023-01-26: 8 mL via INTRAVENOUS

## 2023-07-22 ENCOUNTER — Telehealth: Payer: Self-pay | Admitting: Cardiovascular Disease

## 2023-07-22 NOTE — Telephone Encounter (Signed)
  Pt is returning call from his mychart message

## 2023-07-22 NOTE — Telephone Encounter (Signed)
Returned call and LVM to return our call. Will reply to MyChart message as well.

## 2023-07-22 NOTE — Telephone Encounter (Signed)
  Per MyChart Scheduling message:   Pt c/o of Chest Pain: STAT if active CP, including tightness, pressure, jaw pain, radiating pain to shoulder/upper arm/back, CP unrelieved by Nitro. Symptoms reported of SOB, nausea, vomiting, sweating.  1. Are you having CP right now?    2. Are you experiencing any other symptoms (ex. SOB, nausea, vomiting, sweating)?   3. Is your CP continuous or coming and going?   4. Have you taken Nitroglycerin?   5. How long have you been experiencing CP?    6. If NO CP at time of call then end call with telling Pt to call back or call 911 if Chest pain returns prior to return call from triage team.    it's been the last two days after I've eaten...hopefully just heartburn.  Have had a lot of stress this spring and summer and have had a lot of anxiety...take Xanax when needed for that but it didn't help the tightness yesterday.  Tightness is continuous about 30 minutes then lightens up.  Never taken nitro

## 2023-07-22 NOTE — Telephone Encounter (Signed)
Returned call to pt. No chest pain today but he did yesterday and "a lot" of pressure. The pain has gone away and now he is having just a sustained mild pressure in his chest. He is having a headache the last few days. No swelling in arms or legs. No SOB, lightheadedness, nausea or pain down either arm. Pt states he has been under severe anxiety the last 6 months due to a son having seizures and alcoholism. Pt states it has been the worst 6 months of his life. Pt states he does have heart issues in the family. He is on Xanax for his anxiety and does take them when it gets to be too much. Pt would like advice or recommendation on what to do next. He was unable to give me any BP's.

## 2023-07-26 NOTE — Telephone Encounter (Signed)
Should arrange for office visit evaluation.  If unable to be scheduled by me then see APP in 1 to 2 weeks.

## 2023-07-27 NOTE — Telephone Encounter (Signed)
Returned pt call. Advised pt that Dr. Tresa Endo wants him to make an appt in the next1 to 2 weeks and that the scheduling dept should be reaching out soon. Pt. Verbalized understanding.

## 2023-08-12 ENCOUNTER — Ambulatory Visit: Payer: 59 | Admitting: Physician Assistant

## 2023-08-13 LAB — LAB REPORT - SCANNED: EGFR: 77

## 2023-08-16 NOTE — Progress Notes (Unsigned)
   Cardiology Clinic Note   Date: 08/16/2023 ID: [name], [name] Aug 06, 1961, MRN [id number]  Primary Cardiologist:  David Guadalajara, MD  Patient Profile    David Moreno is a 62 y.o. male who presents to the clinic today for ***    Past medical history significant for: Palpitations. OSA. Hypothyroidism.     History of Present Illness    David Moreno is followed by Dr. Tresa Moreno for the above outlined history.  In summary, he underwent cardiac monitoring which showed 2 NSVT episodes up to 10 beats which occurred during increased personal stress.  Echo September 2013 showed normal LV function mild MR/TR, flat mitral valve closure without definitive prolapse.  Nuclear stress testing September 2013 was negative for ischemia and was a low risk study.  Seen in the office by David Fabian, NP on 08/26/2022 for routine follow-up.  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  He was placed on a gluten-free/dairy free diet through integrative medicine and lost 10 pounds.  He reported sleep apnea and snoring resolved.  Plan to follow-up in 18 months.  Patient contacted the office via MyChart on 07/22/2023 with complaints of of chest pain.  Per triage LPN, "Returned call to pt. No chest pain today but he did yesterday and "a lot" of pressure. The pain has gone away and now he is having just a sustained mild pressure in his chest. He is having a headache the last few days. No swelling in arms or legs. No SOB, lightheadedness, nausea or pain down either arm. Pt states he has been under severe anxiety the last 6 months due to a son having seizures and alcoholism. Pt states it has been the worst 6 months of his life. Pt states he does have heart issues in the family. He is on Xanax for his anxiety and does take them when it gets to be too much. Pt would like advice or recommendation  on what to do next. He was unable to give me any BP's."  Dr. Tresa Moreno recommended office evaluation.  Today, patient ***  Chest pain.*** Palpitations.***   ROS: All other systems reviewed and are otherwise negative except as noted in History of Present Illness.  Studies Reviewed       ***  Risk Assessment/Calculations    {Does this patient have ATRIAL FIBRILLATION?:[phone]} No BP recorded.  {Refresh Note OR Click here to enter BP  :1}***        Physical Exam    VS:  There were no vitals taken for this visit. , BMI There is no height or weight on file to calculate BMI.  GEN: Well nourished, well developed, in no acute distress. Neck: No JVD or carotid bruits. Cardiac: *** RRR. No murmurs. No rubs or gallops.   Respiratory:  Respirations regular and unlabored. Clear to auscultation without rales, wheezing or rhonchi. GI: Soft, nontender, nondistended. Extremities: Radials/DP/PT 2+ and equal bilaterally. No clubbing or cyanosis. No edema ***  Skin: Warm and dry, no rash. Neuro: Strength intact.  Assessment & Plan   ***  Disposition: ***     {Are you ordering a CV Procedure (e.g. stress test, cath, DCCV, TEE, etc)?   Press F2        :[id number]}   Signed, David Moreno. David Danowski, DNP, NP-C

## 2023-08-18 ENCOUNTER — Encounter: Payer: Self-pay | Admitting: Student

## 2023-08-18 ENCOUNTER — Ambulatory Visit: Payer: 59 | Attending: Physician Assistant | Admitting: Student

## 2023-08-18 VITALS — BP 128/80 | HR 47 | Ht 75.0 in | Wt 181.8 lb

## 2023-08-18 DIAGNOSIS — R079 Chest pain, unspecified: Secondary | ICD-10-CM | POA: Diagnosis not present

## 2023-08-18 DIAGNOSIS — R002 Palpitations: Secondary | ICD-10-CM | POA: Diagnosis not present

## 2023-08-18 DIAGNOSIS — R001 Bradycardia, unspecified: Secondary | ICD-10-CM

## 2023-08-18 NOTE — Patient Instructions (Signed)
Medication Instructions:  No changes *If you need a refill on your cardiac medications before your next appointment, please call your pharmacy*   Lab Work: None ordered If you have labs (blood work) drawn today and your tests are completely normal, you will receive your results only by: MyChart Message (if you have MyChart) OR A paper copy in the mail If you have any lab test that is abnormal or we need to change your treatment, we will call you to review the results.   Testing/Procedures:   Your cardiac CT will be scheduled at one of the below locations:   [facility] [address] [place], [place] [address] [phone]  OR  [facility] [address] [place], [place] [address] [phone]  OR   [facility] [address] [place], [place] [address] [phone]  If scheduled at [facility], please arrive at the [facility] and Children's Entrance (Entrance C2) of [facility] 30 minutes prior to test start time. You can use the FREE valet parking offered at entrance C (encouraged to control the heart rate for the test)  Proceed to the [facility] Radiology Department (first floor) to check-in and test prep.  All radiology patients and guests should use entrance C2 at [facility], accessed from [facility], even though the hospital's physical address listed is [address]    If scheduled at [facility] or [facility], please arrive 15 mins early for check-in and test prep.  There is spacious parking and easy access to the radiology department from the [facility] entrance. Please enter here and check-in with the desk attendant.   Please follow these instructions carefully (unless otherwise directed):  An IV will be required for this test and Nitroglycerin will be given.  Hold all  erectile dysfunction medications at least 3 days (72 hrs) prior to test. (Ie viagra, cialis, sildenafil, tadalafil, etc)   On the Night Before the Test: Be sure to Drink plenty of water. Do not consume any caffeinated/decaffeinated beverages or chocolate 12 hours prior to your test. Do not take any antihistamines 12 hours prior to your test. If the patient has contrast allergy: Patient will need a prescription for Prednisone and very clear instructions (as follows): Prednisone 50 mg - take 13 hours prior to test Take another Prednisone 50 mg 7 hours prior to test Take another Prednisone 50 mg 1 hour prior to test Take Benadryl 50 mg 1 hour prior to test Patient must complete all four doses of above prophylactic medications. Patient will need a ride after test due to Benadryl.  On the Day of the Test: Drink plenty of water until 1 hour prior to the test. Do not eat any food 1 hour prior to test. You may take your regular medications prior to the test.  Take metoprolol (Lopressor) two hours prior to test. If you take Furosemide/Hydrochlorothiazide/Spironolactone, please HOLD on the morning of the test. FEMALES- please wear underwire-free bra if available, avoid dresses & tight clothing      After the Test: Drink plenty of water. After receiving IV contrast, you may experience a mild flushed feeling. This is normal. On occasion, you may experience a mild rash up to 24 hours after the test. This is not dangerous. If this occurs, you can take Benadryl 25 mg and increase your fluid intake. If you experience trouble breathing, this can  be serious. If it is severe call 911 IMMEDIATELY. If it is mild, please call our office. If you take any of these medications: Glipizide/Metformin, Avandament, Glucavance, please do not take 48 hours after completing test unless otherwise instructed.  We will call to schedule your test 2-4 weeks out understanding that some insurance companies will need an  authorization prior to the service being performed.   For more information and frequently asked questions, please visit our website : [email or web address]  For non-scheduling related questions, please contact the cardiac imaging nurse navigator should you have any questions/concerns: Cardiac Imaging Nurse Navigators Direct Office Dial: [phone]   For scheduling needs, including cancellations and rescheduling, please call [place], [phone].    Follow-Up: At [facility], you and your health needs are our priority.  As part of our continuing mission to provide you with exceptional heart care, we have created designated Provider Care Teams.  These Care Teams include your primary Cardiologist (physician) and Advanced Practice Providers (APPs -  Physician Assistants and Nurse Practitioners) who all work together to provide you with the care you need, when you need it.  We recommend signing up for the patient portal called "MyChart".  Sign up information is provided on this After Visit Summary.  MyChart is used to connect with patients for Virtual Visits (Telemedicine).  Patients are able to view lab/test results, encounter notes, upcoming appointments, etc.  Non-urgent messages can be sent to your provider as well.   To learn more about what you can do with MyChart, go to [email or web address].    Your next appointment:   1 year(s)  Provider:   Nicki Guadalajara, MD

## 2023-08-26 ENCOUNTER — Ambulatory Visit (HOSPITAL_COMMUNITY): Payer: 59

## 2023-09-11 ENCOUNTER — Ambulatory Visit: Payer: 59 | Admitting: Physician Assistant

## 2024-01-04 ENCOUNTER — Telehealth: Payer: Self-pay

## 2024-01-04 NOTE — Telephone Encounter (Signed)
Patient walked in to clinic c/o elevated BP and chest pressure. He report he has had some depression over the weekend due to his son having seizures for the last year. He took Xanax 30 minutes before coming to clinic. He also report that the last 2 night he has waken up anxious and sweaty. Today while at ENT clinic his BP was 170/110. Offered him the first available which was 3:40. Patient decided not to wait and will go to urgent care for evaluation.

## 2024-01-06 NOTE — Telephone Encounter (Signed)
Patient evaluated at urgent care. Notes from visit below.  2. Elevated blood pressure. His blood pressure was recorded at 151/98, which he attributes to a stressful morning. A recheck of his blood pressure will be performed today was also elevated patient encouraged to monitor his blood pressure at home and if it is remains elevated to discuss with his primary care provider Dr. Rozanna Box.

## 2024-02-29 NOTE — Progress Notes (Unsigned)
 Cardiology Clinic Note   Patient Name: David Moreno Date of Encounter: 03/01/2024  Primary Care Provider:  Tally Joe, MD Primary Cardiologist:  Nicki Guadalajara, MD  Patient Profile    David Moreno 63 year old male presents the clinic today for follow-up evaluation of his palpitations.  Past Medical History    Past Medical History:  Diagnosis Date   Acute appendicitis 12/09/2007   Anxiety    Hypertension    Hypothyroidism    NSVT (nonsustained ventricular tachycardia) [facility])    Nuclear stress test September 2013 was negative for ischemia. Exercise capacity 16 METS, low risk scan.  2-D echocardiogram September 2013 ejection fraction greater than 55%. No valvular disease noted..   Past Surgical History:  Procedure Laterality Date   LAPAROSCOPIC APPENDECTOMY  2009    Allergies  No Known Allergies  History of Present Illness    David Moreno is a PMH of NSVT, mild OSA, fatigue, hypothyroidism, and heart palpitations.  His echocardiogram 9/13 showed an EF of 55%, flattened mitral valve with closure without degenerative prolapse, mild MR, mild TR.  Cardiac event monitor showed 2 episodes of short NSVT up to 10 beats that occurred during a period of increased stress as he was rushing to go to the airport.  His sleep study 11/16 showed mild OSA with an overall AHI of 8.4.  His lowest oxygen saturation was 89%.  He was previously on Bystolic which was discontinued due to fatigue.  He contracted COVID-19 3/21.  He was seen in follow-up by Azalee Course PA-C on 06/12/2021.  During that time he was doing well.  He had a upcoming sleep study scheduled.  He had been started on Protonix.  He noted improvement in his sensation of chest burning.  He continued to be physically active playing tennis outside and denied exertional symptoms.  He had no lower extremity edema.  He denied orthopnea and PND.  His sleep study 08/14/2021 showed significant postural component with supine AHI of  49.2 versus nonsupine sleep 8.6/h.  He was noted to have moderate OSA with an overall AHI of 29.4.  His oxygen saturation decreased to 78.  Recommendations were made for CPAP therapy.  He presented to the clinic 08/26/22 for follow-up evaluation stated he felt well.  He was very physically active playing tennis with a team.  His team had traveled to [place] for a tournament.  He also used his elliptical 3 times per week and played golf.  After his last clinic visit he was seen by integrative medicine in [place].  He was placed on a gluten-free dairy free diet.  He lost around 10 pounds.  His sleep apnea and snoring resolved.   He was seen in follow-up by Carlos Levering NP-C NP-C on 08/18/2023.  He had sent a MyChart message 8/24 with complaints of chest pain.  He reported increased pressure.  At the time of evaluation his pain had gone away but he was having sustained a mild pressure in his chest.  He denied shortness of breath, lightheadedness, nausea, and arm pain.  He reported severe anxiety for the prior 6 months due to his son having seizures and alcoholism.  He was taking Xanax for his anxiety and as needed.  He denied chest pressure with activity such as playing golf, tennis and using his elliptical.  He was noted to have good activity tolerance.  He noted occasional palpitations that felt like pounding.  He continued to be bradycardic which was chronic.  He  denied lightheadedness, dizziness, presyncope or syncope.  Coronary CTA was ordered but not completed.  He presents to the clinic today for evaluation and states he continues to be physically active playing golf 2 times per week and using the elliptical 3-4 times per week.  He is planning to play golf Friday.  He is also planning to head back to his family's camp [address] [place] this spring.  We reviewed his previous cardiology visits and EKG.  He expressed understanding.  His blood pressure today is well-controlled.  We reviewed his  family history.  He does have some varicose veins in his left lower leg.  He is following with vascular.  He has plans for upcoming varicose vein injection and stripping.  His LDL is managed by his PCP.  We will plan follow-up in 12 to 18 months.    Today the denies chest pain, shortness of breath, lower extremity edema, fatigue, palpitations, melena, hematuria, hemoptysis, diaphoresis, weakness, presyncope, syncope, orthopnea, and PND.    Home Medications    Prior to Admission medications   Medication Sig Start Date End Date Taking? Authorizing Provider  escitalopram (LEXAPRO) 10 MG tablet Take 10 mg by mouth daily.    [provider]  levothyroxine (SYNTHROID) 175 MCG tablet Take 175 mcg by mouth daily. 03/16/20   [provider]  pantoprazole (PROTONIX) 40 MG tablet TAKE 1 TABLET BY MOUTH EVERY DAY 07/24/21   Azalee Course, PA  zolpidem (AMBIEN) 10 MG tablet Take 10 mg by mouth at bedtime as needed for sleep. Takes 1/2 tablet prn    [provider]    Family History    Family History  Problem Relation Age of Onset   Heart attack Mother    Heart disease Maternal Grandmother    He indicated that his mother is deceased. He indicated that his father is alive. He indicated that both of his brothers are alive. He indicated that the status of his maternal grandmother is unknown.  Social History    Social History   Socioeconomic History   Marital status: Married    Spouse name: Not on file   Number of children: Not on file   Years of education: Not on file   Highest education level: Not on file  Occupational History   Not on file  Tobacco Use   Smoking status: Never   Smokeless tobacco: Never  Substance and Sexual Activity   Alcohol use: Yes    Alcohol/week: 0.0 standard drinks of alcohol    Comment: beer 2 times per week.   Drug use: No   Sexual activity: Not on file  Other Topics Concern   Not on file  Social History Narrative   Not on file    Social Drivers of Health   Financial Resource Strain: Not on file  Food Insecurity: Not on file  Transportation Needs: Not on file  Physical Activity: Not on file  Stress: Not on file  Social Connections: Not on file  Intimate Partner Violence: Not on file     Review of Systems    General:  No chills, fever, night sweats or weight changes.  Cardiovascular:  No chest pain, dyspnea on exertion, edema, orthopnea, palpitations, paroxysmal nocturnal dyspnea. Dermatological: No rash, lesions/masses Respiratory: No cough, dyspnea Urologic: No hematuria, dysuria Abdominal:   No nausea, vomiting, diarrhea, bright red blood per rectum, melena, or hematemesis Neurologic:  No visual changes, wkns, changes in mental status. All other systems reviewed and are otherwise negative  except as noted above.  Physical Exam    VS:  BP (!) 106/58 (BP Location: Right Arm, Patient Position: Sitting, Cuff Size: Large)   Pulse (!) 54   Ht 6\' 4"  (1.93 m)   Wt 183 lb (83 kg)   SpO2 95%   PF 69 L/min   BMI 22.28 kg/m  , BMI Body mass index is 22.28 kg/m. GEN: Well nourished, well developed, in no acute distress. HEENT: normal. Neck: Supple, no JVD, carotid bruits, or masses. Cardiac: RRR, no murmurs, rubs, or gallops. No clubbing, cyanosis, edema.  Radials/DP/PT 2+ and equal bilaterally.  Left greater than right varicose veins Respiratory:  Respirations regular and unlabored, clear to auscultation bilaterally. GI: Soft, nontender, nondistended, BS + x 4. MS: no deformity or atrophy. Skin: warm and dry, no rash. Neuro:  Strength and sensation are intact. Psych: Normal affect.  Accessory Clinical Findings    Recent Labs: No results found for requested labs within last 365 days.   Recent Lipid Panel    Component Value Date/Time   CHOL 195 08/15/2015 0919   TRIG 106 08/15/2015 0919   HDL 58 08/15/2015 0919   CHOLHDL 3.4 08/15/2015 0919   VLDL 21 08/15/2015 0919   LDLCALC 116 08/15/2015 0919          ECG personally reviewed by me today-EKG Interpretation Date/Time:  Tuesday March 01 2024 09:24:24 EDT Ventricular Rate:  54 PR Interval:  188 QRS Duration:  94 QT Interval:  398 QTC Calculation: 377 R Axis:   84  Text Interpretation: Sinus bradycardia with marked sinus arrhythmia When compared with ECG of 18-Aug-2023 14:42, No significant change was found Confirmed by Edd Fabian [phone]) on 03/01/2024 9:29:13 AM   Sleep Study Type: HST   Indication for sleep study: snoring, nocturnal palpitations, OSA untreated.   Epworth Sleepiness Score: 4   SLEEP STUDY TECHNIQUE A multi-channel overnight portable sleep study was performed. The channels recorded were: nasal airflow, thoracic respiratory movement, and oxygen saturation with a pulse oximetry. Snoring was also monitored.   MEDICATIONS Patient self administered medications include: N/A.   SLEEP ARCHITECTURE Patient was studied for 371.7 minutes. The sleep efficiency was 100.0 % and the patient was supine for 51.2%. The arousal index was 0.0 per hour.   RESPIRATORY PARAMETERS The overall AHI was 29.4 per hour, with a central apnea index of 0.2 per hour. There is a significant positional component with supine sleep AHI 49.2/h versus non-supine sleep AHI 8.6/h.   The oxygen nadir was 78% during sleep.   CARDIAC DATA Mean heart rate during sleep was 55.3 bpm.   IMPRESSIONS - Moderate obstructive sleep apnea overall (AHI 29.4/h); however, events were severe with supine sleep (AHI 49.2/h). The severity during REM sleep cannot be assessed on this home study. - No significant central sleep apnea occurred during this study (CAI = 0.2/h). - Severe oxygen desaturation to a nadir of 78%. - Patient snored 1.8% during the sleep.   DIAGNOSIS - Obstructive Sleep Apnea (G47.33) - Nocturnal Hypoxemia (G47.36)    Assessment & Plan   1.  Chest discomfort-denies exertional chest pain.  Continues to be physically active using  elliptical, golfing, and playing tennis.  Denies exertional chest discomfort. Maintain heart healthy diet Continue physical activity  Palpitations-EKG today shows sinus bradycardia 54 bpm.  Denies recent episodes of accelerated or irregular heart rate.  Previously wore cardiac event monitor which showed 2 episodes of NSVT with the longest being up to 10 beats. Maintain physical activity Heart  healthy low-sodium diet Continue to monitor  OSA-resolved with weight loss.  Reports that his wife notes that his apnea has resolved. Continue current diet Maintain physical activity Sleep hygiene instructions given Avoid supine sleeping.  Hypothyroidism-reports compliance with levothyroxine. Follows with PCP  Family history of coronary disease-mother had MI in her 90s grand father had MI in his 35s.  EKG today shows sinus bradycardia, no acute changes.  Continue with modifiable risk factors Heart healthy low-sodium diet Maintain physical activity  Disposition: Follow-up with Dr. Bjorn Pippin or me in 12-18 months.   Thomasene Ripple. Jennah Satchell NP-C     03/01/2024, 9:29 AM [facility] [address] Office [phone] Fax [phone]  Notice: This dictation was prepared with Dragon dictation along with smaller phrase technology. Any transcriptional errors that result from this process are unintentional and may not be corrected upon review.  I spent 14 minutes examining this patient, reviewing medications, and using patient centered shared decision making involving her cardiac care.   I spent  20 minutes reviewing his past medical history,  medications, and prior cardiac tests.

## 2024-03-01 ENCOUNTER — Encounter: Payer: Self-pay | Admitting: General Practice

## 2024-03-01 ENCOUNTER — Ambulatory Visit: Payer: 59 | Attending: General Practice | Admitting: General Practice

## 2024-03-01 VITALS — BP 106/58 | HR 54 | Ht 76.0 in | Wt 183.0 lb

## 2024-03-01 DIAGNOSIS — R001 Bradycardia, unspecified: Secondary | ICD-10-CM | POA: Diagnosis not present

## 2024-03-01 DIAGNOSIS — G4733 Obstructive sleep apnea (adult) (pediatric): Secondary | ICD-10-CM

## 2024-03-01 DIAGNOSIS — R079 Chest pain, unspecified: Secondary | ICD-10-CM | POA: Diagnosis not present

## 2024-03-01 DIAGNOSIS — Z8249 Family history of ischemic heart disease and other diseases of the circulatory system: Secondary | ICD-10-CM | POA: Diagnosis not present

## 2024-03-01 DIAGNOSIS — R002 Palpitations: Secondary | ICD-10-CM | POA: Diagnosis not present

## 2024-03-01 DIAGNOSIS — E039 Hypothyroidism, unspecified: Secondary | ICD-10-CM

## 2024-03-01 NOTE — Patient Instructions (Addendum)
 Medication Instructions:  The current medical regimen is effective;  continue present plan and medications as directed. Please refer to the Current Medication list given to you today.  *If you need a refill on your cardiac medications before your next appointment, please call your pharmacy*  Lab Work: NONE If you have labs (blood work) drawn today and your tests are completely normal, you will receive your results only by:  MyChart Message (if you have MyChart) OR  A paper copy in the mail If you have any lab test that is abnormal or we need to change your treatment, we will call you to review the results.  Follow-Up: At [facility], you and your health needs are our priority.  As part of our continuing mission to provide you with exceptional heart care, we have created designated Provider Care Teams.  These Care Teams include your primary Cardiologist (physician) and Advanced Practice Providers (APPs -  Physician Assistants and Nurse Practitioners) who all work together to provide you with the care you need, when you need it.  Your next appointment:   12-18 month(s)  Provider:   Little Ishikawa, MD  or Edd Fabian, FNP-C         Other Instructions MAINTAIN YOUR PHYSICAL ACTIVITY-AS TOLERATED PLEASE READ AND FOLLOW ATTACHED HEART HEALTHY EATING PLAN   Heart-Healthy Eating Plan Eating a healthy diet is important for the health of your heart. A heart-healthy eating plan includes: Eating less unhealthy fats. Eating more healthy fats. Eating less salt in your food. Salt is also called sodium. Making other changes in your diet. Talk with your doctor or a diet specialist (dietitian) to create an eating plan that is right for you. What are tips for following this plan? Cooking Avoid frying your food. Try to bake, boil, grill, or broil it instead. You can also reduce fat by: Removing the skin from poultry. Removing all visible fats from meats. Steaming vegetables in water  or broth. Meal planning  At meals, divide your plate into four equal parts: Fill one-half of your plate with vegetables and green salads. Fill one-fourth of your plate with whole grains. Fill one-fourth of your plate with lean protein foods. Eat 2-4 cups of vegetables per day. One cup of vegetables is: 1 cup (91 g) broccoli or cauliflower florets. 2 medium carrots. 1 large bell pepper. 1 large sweet potato. 1 large tomato. 1 medium white potato. 2 cups (150 g) raw leafy greens. Eat 1-2 cups of fruit per day. One cup of fruit is: 1 small apple 1 large banana 1 cup (237 g) mixed fruit, 1 large orange,  cup (82 g) dried fruit, 1 cup (240 mL) 100% fruit juice. Eat more foods that have soluble fiber. These are apples, broccoli, carrots, beans, peas, and barley. Try to get 20-30 g of fiber per day. Eat 4-5 servings of nuts, legumes, and seeds per week: 1 serving of dried beans or legumes equals  cup (90 g) cooked. 1 serving of nuts is  oz (12 almonds, 24 pistachios, or 7 walnut halves). 1 serving of seeds equals  oz (8 g). General information Eat more home-cooked food. Eat less restaurant, buffet, and fast food. Limit or avoid alcohol. Limit foods that are high in starch and sugar. Avoid fried foods. Lose weight if you are overweight. Keep track of how much salt (sodium) you eat. This is important if you have high blood pressure. Ask your doctor to tell you more about this. Try to add vegetarian  meals each week. Fats Choose healthy fats. These include olive oil and canola oil, flaxseeds, walnuts, almonds, and seeds. Eat more omega-3 fats. These include salmon, mackerel, sardines, tuna, flaxseed oil, and ground flaxseeds. Try to eat fish at least 2 times each week. Check food labels. Avoid foods with trans fats or high amounts of saturated fat. Limit saturated fats. These are often found in animal products, such as meats, butter, and cream. These are also found in plant  foods, such as palm oil, palm kernel oil, and coconut oil. Avoid foods with partially hydrogenated oils in them. These have trans fats. Examples are stick margarine, some tub margarines, cookies, crackers, and other baked goods. What foods should I eat? Fruits All fresh, canned (in natural juice), or frozen fruits. Vegetables Fresh or frozen vegetables (raw, steamed, roasted, or grilled). Green salads. Grains Most grains. Choose whole wheat and whole grains most of the time. Rice and pasta, including brown rice and pastas made with whole wheat. Meats and other proteins Lean, well-trimmed beef, veal, pork, and lamb. Chicken and [place] without skin. All fish and shellfish. Wild duck, rabbit, pheasant, and venison. Egg whites or low-cholesterol egg substitutes. Dried beans, peas, lentils, and tofu. Seeds and most nuts. Dairy Low-fat or nonfat cheeses, including ricotta and mozzarella. Skim or 1% milk that is liquid, powdered, or evaporated. Buttermilk that is made with low-fat milk. Nonfat or low-fat yogurt. Fats and oils Non-hydrogenated (trans-free) margarines. Vegetable oils, including soybean, sesame, sunflower, olive, peanut, safflower, corn, canola, and cottonseed. Salad dressings or mayonnaise made with a vegetable oil. Beverages Mineral water. Coffee and tea. Diet carbonated beverages. Sweets and desserts Sherbet, gelatin, and fruit ice. Small amounts of dark chocolate. Limit all sweets and desserts. Seasonings and condiments All seasonings and condiments. The items listed above may not be a complete list of foods and drinks you can eat. Contact a dietitian for more options. What foods should I avoid? Fruits Canned fruit in heavy syrup. Fruit in cream or butter sauce. Fried fruit. Limit coconut. Vegetables Vegetables cooked in cheese, cream, or butter sauce. Fried vegetables. Grains Breads that are made with saturated or trans fats, oils, or whole milk. Croissants. Sweet rolls.  Donuts. High-fat crackers, such as cheese crackers. Meats and other proteins Fatty meats, such as hot dogs, ribs, sausage, bacon, rib-eye roast or steak. High-fat deli meats, such as salami and bologna. Caviar. Domestic duck and goose. Organ meats, such as liver. Dairy Cream, sour cream, cream cheese, and creamed cottage cheese. Whole-milk cheeses. Whole or 2% milk that is liquid, evaporated, or condensed. Whole buttermilk. Cream sauce or high-fat cheese sauce. Yogurt that is made from whole milk. Fats and oils Meat fat, or shortening. Cocoa butter, hydrogenated oils, palm oil, coconut oil, palm kernel oil. Solid fats and shortenings, including bacon fat, salt pork, lard, and butter. Nondairy cream substitutes. Salad dressings with cheese or sour cream. Beverages Regular sodas and juice drinks with added sugar. Sweets and desserts Frosting. Pudding. Cookies. Cakes. Pies. Milk chocolate or white chocolate. Buttered syrups. Full-fat ice cream or ice cream drinks. The items listed above may not be a complete list of foods and drinks to avoid. Contact a dietitian for more information. Summary Heart-healthy meal planning includes eating less unhealthy fats, eating more healthy fats, and making other changes in your diet. Eat a balanced diet. This includes fruits and vegetables, low-fat or nonfat dairy, lean protein, nuts and legumes, whole grains, and heart-healthy oils and fats. This information is not intended to replace  advice given to you by your health care provider. Make sure you discuss any questions you have with your health care provider. Document Revised: 12/30/2021 Document Reviewed: 12/30/2021 Elsevier Patient Education  2024 [employer].
